# Patient Record
Sex: Male | Born: 1965 | Race: White | Hispanic: No | Marital: Single | Smoking: Current every day smoker
Health system: Southern US, Community
[De-identification: ages and names within clinical notes are randomized; demographics above are authoritative.]

## PROBLEM LIST (undated history)

## (undated) DIAGNOSIS — F1010 Alcohol abuse, uncomplicated: Secondary | ICD-10-CM

## (undated) DIAGNOSIS — F1910 Other psychoactive substance abuse, uncomplicated: Secondary | ICD-10-CM

## (undated) DIAGNOSIS — F1020 Alcohol dependence, uncomplicated: Secondary | ICD-10-CM

## (undated) HISTORY — PX: HERNIA REPAIR: SHX51

---

## 2001-04-17 ENCOUNTER — Emergency Department (HOSPITAL_COMMUNITY): Admission: EM | Admit: 2001-04-17 | Discharge: 2001-04-17 | Payer: Self-pay | Admitting: Internal Medicine

## 2005-08-19 ENCOUNTER — Emergency Department (HOSPITAL_COMMUNITY): Admission: EM | Admit: 2005-08-19 | Discharge: 2005-08-20 | Payer: Self-pay | Admitting: Emergency Medicine

## 2011-11-22 ENCOUNTER — Encounter (HOSPITAL_COMMUNITY): Payer: Self-pay | Admitting: Emergency Medicine

## 2011-11-22 ENCOUNTER — Emergency Department (HOSPITAL_COMMUNITY)
Admission: EM | Admit: 2011-11-22 | Discharge: 2011-11-22 | Payer: Self-pay | Attending: Emergency Medicine | Admitting: Emergency Medicine

## 2011-11-22 DIAGNOSIS — F1010 Alcohol abuse, uncomplicated: Secondary | ICD-10-CM | POA: Insufficient documentation

## 2011-11-22 DIAGNOSIS — R002 Palpitations: Secondary | ICD-10-CM | POA: Insufficient documentation

## 2011-11-22 DIAGNOSIS — F1910 Other psychoactive substance abuse, uncomplicated: Secondary | ICD-10-CM

## 2011-11-22 DIAGNOSIS — T07XXXA Unspecified multiple injuries, initial encounter: Secondary | ICD-10-CM

## 2011-11-22 DIAGNOSIS — Z79899 Other long term (current) drug therapy: Secondary | ICD-10-CM | POA: Insufficient documentation

## 2011-11-22 DIAGNOSIS — IMO0002 Reserved for concepts with insufficient information to code with codable children: Secondary | ICD-10-CM | POA: Insufficient documentation

## 2011-11-22 HISTORY — DX: Alcohol abuse, uncomplicated: F10.10

## 2011-11-22 HISTORY — DX: Alcohol dependence, uncomplicated: F10.20

## 2011-11-22 NOTE — ED Notes (Signed)
Informed patient and/or family of status. [employer] remains at bedside. No voiced complaints presently. NAD. Awaiting disposition

## 2011-11-22 NOTE — ED Provider Notes (Signed)
History     CSN: [id number]  Arrival date & time 11/22/11  1318   First MD Initiated Contact with Patient 11/22/11 1456      Chief Complaint  Patient presents with  . Ingestion   patient with a known history of alcohol abuse. Brought in by police today in custody after being pulled over. States his last drink was approximately 3-4 hours ago and admits to chronic ethanol abuse. He also, states he took approximately 4 of his Valium tablets that he had with him. He states he was apparently trying to "get rid of them." He normally takes them on a daily basis. He denies any symptoms other than having some abrasions to his forehead from being taken into custody. His tetanus is up to date. He denies any headache. No loss of consciousness. Denies any chest pain, shortness of breath. He has no abdominal pain. No nausea, vomiting. He denies any complaints at this time. Somewhat uncooperative with his exam. He appears to be awake and alert. This time. He specifically denies any suicidal or homicidal thoughts. He denies any other coingestants.  (Consider location/radiation/quality/duration/timing/severity/associated sxs/prior treatment) HPI  Past Medical History  Diagnosis Date  . Alcoholism     History reviewed. No pertinent past surgical history.  No family history on file.  History  Substance Use Topics  . Smoking status: Current Everyday Smoker  . Smokeless tobacco: Not on file  . Alcohol Use: Yes      Review of Systems  Constitutional: Negative for fatigue.  Respiratory: Negative for chest tightness and shortness of breath.   Cardiovascular: Positive for palpitations.  Neurological: Negative for dizziness and syncope.  Psychiatric/Behavioral: Negative for confusion.  All other systems reviewed and are negative.    Allergies  Review of patient's allergies indicates no known allergies.  Home Medications   Current Outpatient Rx  Name Route Sig Dispense Refill  . DIAZEPAM 10  MG PO TABS Oral Take 10 mg by mouth every 6 (six) hours as needed. anxiety      BP 142/111  Pulse 96  Temp(Src) 97.6 F (36.4 C) (Oral)  Resp 20  SpO2 96%  Physical Exam  Nursing note and vitals reviewed. Constitutional: He is oriented to person, place, and time. He appears well-developed and well-nourished. No distress.  HENT:  Head: Normocephalic.  Mouth/Throat: Oropharynx is clear and moist.       Scattered abrasions to the right side of the forehead with some dried blood. No active bleeding. No bony deformity. No hematoma.  Eyes: Pupils are equal, round, and reactive to light.       Pupils 3-4 mm, equal, round, reactive to light  Cardiovascular: Normal heart sounds.   Pulmonary/Chest: Breath sounds normal.  Abdominal: Soft.  Musculoskeletal: Normal range of motion.  Neurological: He is alert and oriented to person, place, and time.       Awake alert. Speech is fluent. Cranial nerves normal as tested.  Skin: Skin is warm and dry.  Psychiatric:       Patient's answers questions appropriately. Awake, alert. No sign of any abnormal behavior    ED Course  Procedures (including critical care time)  Labs Reviewed - No data to display No results found.   No diagnosis found.    MDM  Pt is seen and examined;  Initial history and physical completed.  Will follow.      Plan:  Will observe for a brief period.  No indications for labs at this time.  Awake  and alert.  NAD.  Exam normal other than some abrasions.    Will provide wound care and observe for a short period,  Has already been in ED for over 1.5 hours with no behavior change.  Td up to date. No sig. ETOH intoxication appreciated.     4:23 PM  Patient has been continually observed. He remains awake, alert, oriented, with no signs of any lethargy or confusion. He'll be stable for discharge back with the [employer]. They're instructed to bring him back to the ER for any concerns or changing symptoms    Kirstyn Lean A.  Patrica Duel, MD 11/22/11 1624

## 2011-11-22 NOTE — ED Notes (Signed)
[employer] had pt in custody and pt takes 4-6  10mg   Valium pt also has etoh on board

## 2011-11-22 NOTE — ED Notes (Signed)
Presently in custody of [employer]. Pt observed by [employer] swallow handful of pills. Found with more pills on person, valium 10mg  pills. Pt admits to just swallowing 4 pills. States he took them "to get rid of them".

## 2015-07-25 ENCOUNTER — Emergency Department (HOSPITAL_COMMUNITY): Payer: Self-pay

## 2015-07-25 ENCOUNTER — Encounter (HOSPITAL_COMMUNITY): Payer: Self-pay | Admitting: Emergency Medicine

## 2015-07-25 ENCOUNTER — Emergency Department (HOSPITAL_COMMUNITY)
Admission: EM | Admit: 2015-07-25 | Discharge: 2015-07-26 | Disposition: A | Discharge status: Home / Self Care | Payer: Self-pay | Attending: Emergency Medicine | Admitting: Emergency Medicine

## 2015-07-25 DIAGNOSIS — J159 Unspecified bacterial pneumonia: Secondary | ICD-10-CM | POA: Insufficient documentation

## 2015-07-25 DIAGNOSIS — R112 Nausea with vomiting, unspecified: Secondary | ICD-10-CM | POA: Insufficient documentation

## 2015-07-25 DIAGNOSIS — J189 Pneumonia, unspecified organism: Secondary | ICD-10-CM

## 2015-07-25 DIAGNOSIS — R197 Diarrhea, unspecified: Secondary | ICD-10-CM | POA: Insufficient documentation

## 2015-07-25 DIAGNOSIS — Z720 Tobacco use: Secondary | ICD-10-CM | POA: Insufficient documentation

## 2015-07-25 DIAGNOSIS — F1020 Alcohol dependence, uncomplicated: Secondary | ICD-10-CM | POA: Insufficient documentation

## 2015-07-25 LAB — COMPREHENSIVE METABOLIC PANEL
ALT: 12 U/L — ABNORMAL LOW (ref 17–63)
ANION GAP: 6 (ref 5–15)
AST: 21 U/L (ref 15–41)
Albumin: 3.3 g/dL — ABNORMAL LOW (ref 3.5–5.0)
Alkaline Phosphatase: 93 U/L (ref 38–126)
BUN: 15 mg/dL (ref 6–20)
CO2: 29 mmol/L (ref 22–32)
Calcium: 8.6 mg/dL — ABNORMAL LOW (ref 8.9–10.3)
Chloride: 102 mmol/L (ref 101–111)
Creatinine, Ser: 1.01 mg/dL (ref 0.61–1.24)
GFR calc Af Amer: >60 mL/min (ref >60)
GFR calc non Af Amer: >60 mL/min (ref >60)
GLUCOSE: 127 mg/dL — AB (ref 65–99)
Potassium: 3.8 mmol/L (ref 3.5–5.1)
Sodium: 137 mmol/L (ref 135–145)
Total Bilirubin: 0.6 mg/dL (ref 0.3–1.2)
Total Protein: 7 g/dL (ref 6.5–8.1)

## 2015-07-25 LAB — CBC WITH DIFFERENTIAL/PLATELET
Basophils Absolute: 0.1 10*3/uL (ref 0.0–0.1)
Basophils Relative: 1 %
Eosinophils Absolute: 0.4 10*3/uL (ref 0.0–0.7)
Eosinophils Relative: 3 %
HCT: 45.6 % (ref 39.0–52.0)
Hemoglobin: 16.2 g/dL (ref 13.0–17.0)
Lymphocytes Relative: 22 %
Lymphs Abs: 2.9 10*3/uL (ref 0.7–4.0)
MCH: 36.7 pg — ABNORMAL HIGH (ref 26.0–34.0)
MCHC: 35.5 g/dL (ref 30.0–36.0)
MCV: 103.4 fL — ABNORMAL HIGH (ref 78.0–100.0)
Monocytes Absolute: 1.2 10*3/uL — ABNORMAL HIGH (ref 0.1–1.0)
Monocytes Relative: 9 %
NEUTROS PCT: 65 %
Neutro Abs: 8.5 10*3/uL — ABNORMAL HIGH (ref 1.7–7.7)
Platelets: 238 10*3/uL (ref 150–400)
RBC: 4.41 MIL/uL (ref 4.22–5.81)
RDW: 13.3 % (ref 11.5–15.5)
WBC: 13.1 10*3/uL — AB (ref 4.0–10.5)

## 2015-07-25 LAB — RAPID URINE DRUG SCREEN, HOSP PERFORMED
Amphetamines: NOT DETECTED
BENZODIAZEPINES: NOT DETECTED
Barbiturates: NOT DETECTED
Cocaine: NOT DETECTED
OPIATES: NOT DETECTED
TETRAHYDROCANNABINOL: NOT DETECTED

## 2015-07-25 LAB — ETHANOL: Alcohol, Ethyl (B): <5 mg/dL (ref <5)

## 2015-07-25 MED ORDER — NICOTINE 21 MG/24HR TD PT24
21.0000 mg | MEDICATED_PATCH | Freq: Every day | TRANSDERMAL | Status: DC
  Administered 2015-07-26: 21 mg via TRANSDERMAL
  Filled 2015-07-25: qty 1

## 2015-07-25 MED ORDER — IBUPROFEN 200 MG PO TABS: 600.0000 mg | ORAL_TABLET | Freq: Three times a day (TID) | ORAL | Status: DC | PRN

## 2015-07-25 MED ORDER — SODIUM CHLORIDE 0.9 % IV BOLUS (SEPSIS)
1000.0000 mL | Freq: Once | INTRAVENOUS | Status: AC
  Administered 2015-07-25: 1000 mL via INTRAVENOUS

## 2015-07-25 MED ORDER — ONDANSETRON HCL 4 MG PO TABS: 4.0000 mg | ORAL_TABLET | Freq: Three times a day (TID) | ORAL | Status: DC | PRN

## 2015-07-25 MED ORDER — AMOXICILLIN-POT CLAVULANATE 875-125 MG PO TABS
1.0000 | ORAL_TABLET | Freq: Two times a day (BID) | ORAL | Status: DC
  Administered 2015-07-25 – 2015-07-26 (×2): 1 via ORAL
  Filled 2015-07-25 (×2): qty 1

## 2015-07-25 MED ORDER — THIAMINE HCL 100 MG/ML IJ SOLN: 100.0000 mg | Freq: Every day | INTRAMUSCULAR | Status: DC

## 2015-07-25 MED ORDER — ACETAMINOPHEN 325 MG PO TABS
650.0000 mg | ORAL_TABLET | ORAL | Status: DC | PRN
  Administered 2015-07-25: 650 mg via ORAL
  Filled 2015-07-25: qty 2

## 2015-07-25 MED ORDER — LORAZEPAM 1 MG PO TABS: 0.0000 mg | ORAL_TABLET | Freq: Two times a day (BID) | ORAL | Status: DC

## 2015-07-25 MED ORDER — LORAZEPAM 1 MG PO TABS
0.0000 mg | ORAL_TABLET | Freq: Four times a day (QID) | ORAL | Status: DC
  Administered 2015-07-25: 1 mg via ORAL
  Administered 2015-07-26: 2 mg via ORAL
  Filled 2015-07-25: qty 1
  Filled 2015-07-25: qty 2

## 2015-07-25 MED ORDER — VITAMIN B-1 100 MG PO TABS
100.0000 mg | ORAL_TABLET | Freq: Every day | ORAL | Status: DC
  Administered 2015-07-25 – 2015-07-26 (×2): 100 mg via ORAL
  Filled 2015-07-25 (×2): qty 1

## 2015-07-25 NOTE — ED Notes (Signed)
Gave patient a sandwich.

## 2015-07-25 NOTE — ED Notes (Signed)
Pt reports drinking approximately 1/5 liquor per day. Yesterday had 1/5 liquor and a 12 pack of beer. Is starting to have tremors and feeling nauseous. Was treated for alcoholism "a long time ago." Says drinking has been a problem "all his life." No other c/c. Reports losing 40 lbs in 6 months.
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
## 2015-07-25 NOTE — ED Notes (Signed)
Informed the pt that a urine specimen.

## 2015-07-25 NOTE — ED Provider Notes (Signed)
CSN: [id number]     Arrival date & time 07/25/15  1656 History   First MD Initiated Contact with Patient 07/25/15 1902     Chief Complaint  Patient presents with  . Delirium Tremens (DTS)  . Alcohol Problem     (Consider location/radiation/quality/duration/timing/severity/associated sxs/prior Treatment) Patient is a 49 y.o. male presenting with alcohol problem. The history is provided by the patient.  Alcohol Problem This is a chronic problem. Pertinent negatives include no chest pain, no abdominal pain, no headaches and no shortness of breath.  patient presents for treatment of his alcoholism.  He states that he drinks a fifth of liquor and a case of beer a day. He states he last drank yesterday. States he started to have nausea vomiting diarrhea. States he has this when he is withdrawing. Denies other drug use but does smoke. States that he also has had a cough with some yellow sputum production. States he's felt somewhat bad all over.  Past Medical History  Diagnosis Date  . Alcoholism (HCC)   . Alcohol abuse    History reviewed. No pertinent past surgical history. History reviewed. No pertinent family history. Social History  Substance Use Topics  . Smoking status: Current Every Day Smoker -- 1.00 packs/day for 30 years    Types: Cigarettes  . Smokeless tobacco: None  . Alcohol Use: 7.2 oz/week    12 Cans of beer per week     Comment: Avg 12 beers daily x 20 yrs    Review of Systems  Constitutional: Positive for fatigue. Negative for activity change and appetite change.  Eyes: Negative for pain.  Respiratory: Positive for cough. Negative for chest tightness and shortness of breath.   Cardiovascular: Negative for chest pain and leg swelling.  Gastrointestinal: Positive for nausea, vomiting and diarrhea. Negative for abdominal pain.  Genitourinary: Negative for flank pain.  Musculoskeletal: Negative for back pain and neck stiffness.  Skin: Negative for rash.  Neurological:  Negative for weakness, numbness and headaches.  Psychiatric/Behavioral: Positive for dysphoric mood. Negative for suicidal ideas and behavioral problems.      Allergies  Review of patient's allergies indicates no known allergies.  Home Medications   Prior to Admission medications   Not on File   BP 129/76 mmHg  Pulse 80  Temp(Src) 98.1 F (36.7 C) (Oral)  Resp 16  SpO2 94% Physical Exam  Constitutional: He is oriented to person, place, and time. He appears well-developed and well-nourished.  HENT:  Head: Normocephalic and atraumatic.  Neck: Normal range of motion. Neck supple.  Cardiovascular: Normal rate, regular rhythm and normal heart sounds.   No murmur heard. Pulmonary/Chest: He has wheezes.  Mild scattered wheezes, more harsh on the left.  Abdominal: Soft. Bowel sounds are normal. He exhibits no distension and no mass. There is no tenderness. There is no rebound and no guarding.  Musculoskeletal: Normal range of motion. He exhibits no edema.  Neurological: He is alert and oriented to person, place, and time. No cranial nerve deficit.  Skin: Skin is warm and dry.  Psychiatric: He has a normal mood and affect.  Nursing note and vitals reviewed.   ED Course  Procedures (including critical care time) Labs Review Labs Reviewed  COMPREHENSIVE METABOLIC PANEL - Abnormal; Notable for the following:    Glucose, Bld 127 (*)    Calcium 8.6 (*)    Albumin 3.3 (*)    ALT 12 (*)    All other components within normal limits  CBC WITH DIFFERENTIAL/PLATELET -  Abnormal; Notable for the following:    WBC 13.1 (*)    MCV 103.4 (*)    MCH 36.7 (*)    Neutro Abs 8.5 (*)    Monocytes Absolute 1.2 (*)    All other components within normal limits  ETHANOL  URINE RAPID DRUG SCREEN, HOSP PERFORMED    Imaging Review Dg Chest 2 View  07/25/2015   CLINICAL DATA:  Cough x 1 + month - pt states hx asthma - pt states he is here for detox - long time smoker - Pt reports drinking  approximately 1/5 liquor per day. Yesterday had 1/5 liquor and a 12 pack of beer.  EXAM: CHEST  2 VIEW  COMPARISON:  None.  FINDINGS: Lungs are hyperinflated. There is perihilar peribronchial thickening. More focal opacity is identified in the right mid lung zone raising the question of developing infiltrate. No pulmonary edema or pleural effusions.  IMPRESSION: 1. Significant hyperinflation and bronchitic changes. 2. Question of infiltrate in the right mid lung zone. Followup PA and lateral chest X-ray is recommended in 3-4 weeks following trial of antibiotic therapy to ensure resolution and exclude underlying malignancy.   Electronically Signed   By: Norva Pavlov M.D.   On: 07/25/2015 19:25   I have personally reviewed and evaluated these images and lab results as part of my medical decision-making.   EKG Interpretation None      MDM   Final diagnoses:  Alcoholism (HCC)  CAP (community acquired pneumonia)    Patient requesting treatment for his alcoholism. Some depression. Also found to have pneumonia. Oral antibiotics given. States he has had severe withdrawal in the past. Appears medically cleared with the pneumonia to be seen by TTS.    Benjiman Core, MD 07/26/15 [phone]

## 2015-07-26 DIAGNOSIS — F1020 Alcohol dependence, uncomplicated: Secondary | ICD-10-CM | POA: Diagnosis present

## 2015-07-26 MED ORDER — CEFTRIAXONE SODIUM 1 G IJ SOLR
1.0000 g | Freq: Once | INTRAMUSCULAR | Status: AC
  Administered 2015-07-26: 1 g via INTRAMUSCULAR
  Filled 2015-07-26: qty 10

## 2015-07-26 MED ORDER — BENZONATATE 100 MG PO CAPS
200.0000 mg | ORAL_CAPSULE | Freq: Once | ORAL | Status: AC
  Administered 2015-07-26: 200 mg via ORAL
  Filled 2015-07-26: qty 2

## 2015-07-26 MED ORDER — AMOXICILLIN-POT CLAVULANATE 875-125 MG PO TABS
1.0000 | ORAL_TABLET | Freq: Two times a day (BID) | ORAL | Status: DC
  Administered 2015-07-26: 1 via ORAL
  Filled 2015-07-26: qty 1

## 2015-07-26 MED ORDER — AMOXICILLIN-POT CLAVULANATE 875-125 MG PO TABS: 1.0000 | ORAL_TABLET | Freq: Two times a day (BID) | ORAL | Status: DC

## 2015-07-26 MED ORDER — CHLORDIAZEPOXIDE HCL 25 MG PO CAPS: ORAL_CAPSULE | ORAL | Status: DC

## 2015-07-26 NOTE — BH Assessment (Signed)
[facility] Assessment Progress Note  Per Carolanne Grumbling, MD, this pt does not require psychiatric hospitalization at this time.  He is to be discharged from [facility] with substance abuse treatment referrals.  Discharge instructions include referral information for [facility] for outpatient treatment, and for the [facility] for residential treatment.  Pt's nurse, Morrie Sheldon, and the EDP, Dr Criss Alvine, have been notified.  Doylene Canning, MA Triage Specialist [phone]

## 2015-07-26 NOTE — ED Notes (Signed)
TTS UPDATED ON PT'S CURRENT STATUS

## 2015-07-26 NOTE — ED Provider Notes (Signed)
Psych has seen patient and cleared him for discharge from a psych perspective. Outpatient resources for alcoholism. Does not appear to be withdrawing currently though has a significant risk to. Will discharge on librium. Patient encouraged to find outpatient detox. As for his PNA, he is afebrile, not hypoxic and no increased WOB. Able to ambulate without dyspnea or hypoxia. No signs of sepsis. Discharge with antibiotics.  Pricilla Loveless, MD 07/26/15 [phone]
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
## 2015-07-26 NOTE — Discharge Instructions (Addendum)
To help you maintain a sober lifestyle, a substance abuse treatment program may be beneficial to you.  Contact one of the following treatment providers to see about enrolling in their program:  OUTPATIENT PROGRAMS:       [facility]      [address]      [place], [place] [address]      [phone]      New patients are seen at the walk-in clinic every Tuesday from 9:00 am - 12:00 pm.  RESIDENTIAL PROGRAMS:       [facility]      [address]      [place], [place] [address]      [phone]      [email or web address]

## 2015-07-26 NOTE — ED Notes (Signed)
Per [facility] psych provider, pt is discharged from psych stand point, EDP can discharge when pt deemed appropriate.

## 2015-07-26 NOTE — B[facility] Suicide Risk Assessment (Cosign Needed)
Suicide Risk Assessment  Discharge Assessment   [facility] Discharge Suicide Risk Assessment   Demographic Factors:  Male, Caucasian, Low socioeconomic status and Living alone  Total Time spent with patient: 20 minutes  Musculoskeletal: Strength & Muscle Tone: within normal limits Gait & Station: normal Patient leans: N/A  Psychiatric Specialty Exam:     Blood pressure 125/84, pulse 92, temperature 98 F (36.7 C), temperature source Oral, resp. rate 17, height  (1.753 m), weight 74.844 kg (165 lb), SpO2 94 %.Body mass index is 24.36 kg/(m^2).  General Appearance: Casual  Eye Contact:: Good  Speech: Clear and Coherent and Normal Rate  Volume: Normal  Mood: Depressed  Affect: Congruent  Thought Process: Coherent, Goal Directed and Intact  Orientation: Full (Time, Place, and Person)  Thought Content: WDL  Suicidal Thoughts: No  Homicidal Thoughts: No  Memory: Immediate; Good Recent; Good Remote; Good  Judgement: Fair  Insight: Good  Psychomotor Activity: Normal  Concentration: Good  Recall: NA  Fund of Knowledge:Fair  Language: Good  Akathisia: NA  Handed: Right  AIMS (if indicated):    Assets: Desire for Improvement  ADL's: Intact  Cognition: WNL            Has this patient used any form of tobacco in the last 30 days? (Cigarettes, Smokeless Tobacco, Cigars, and/or Pipes) Yes, A prescription for an FDA-approved tobacco cessation medication was offered at discharge and the patient refused  Mental Status Per Nursing Assessment::   On Admission:     Current Mental Status by Physician: NA  Loss Factors: NA  Historical Factors: NA  Risk Reduction Factors:   Religious beliefs about death  Continued Clinical Symptoms:  Alcohol/Substance Abuse/Dependencies  Cognitive Features That Contribute To Risk:  Polarized thinking    Suicide Risk:  Minimal: No identifiable suicidal ideation.  Patients presenting  with no risk factors but with morbid ruminations; may be classified as minimal risk based on the severity of the depressive symptoms  Principal Problem: Alcohol use disorder, severe, dependence [facility]) Discharge Diagnoses:  Patient Active Problem List   Diagnosis Date Noted  . Alcohol use disorder, severe, dependence ([facility]) [F10.20] 07/26/2015    Priority: High  . Alcohol abuse [F10.10]     Follow-up Information    Follow up with [facility]   Specialty:  Emergency Medicine   Why:  If symptoms worsen   Contact information:   [address] [id number] mc [place] [address] [phone]      Follow up with [facility]     In 1 week.   Why:  for follow up of your pneumonia   Contact information:   [address] [place] [address] [phone]      Plan Of Care/Follow-up recommendations:  Activity:  As tolerated Diet:  regular  Is patient on multiple antipsychotic therapies at discharge:  No   Has Patient had three or more failed trials of antipsychotic monotherapy by history:  No  Recommended Plan for Multiple Antipsychotic Therapies: NA    Kanetra Ho C   PMHNP-BC 07/26/2015, 12:10 PM

## 2015-11-20 ENCOUNTER — Emergency Department (HOSPITAL_COMMUNITY)
Admission: EM | Admit: 2015-11-20 | Discharge: 2015-11-21 | Disposition: A | Discharge status: Home / Self Care | Payer: Self-pay | Attending: Emergency Medicine | Admitting: Emergency Medicine

## 2015-11-20 ENCOUNTER — Encounter (HOSPITAL_COMMUNITY): Payer: Self-pay | Admitting: Emergency Medicine

## 2015-11-20 DIAGNOSIS — R4182 Altered mental status, unspecified: Secondary | ICD-10-CM | POA: Insufficient documentation

## 2015-11-20 DIAGNOSIS — F17210 Nicotine dependence, cigarettes, uncomplicated: Secondary | ICD-10-CM | POA: Insufficient documentation

## 2015-11-20 DIAGNOSIS — F10121 Alcohol abuse with intoxication delirium: Secondary | ICD-10-CM | POA: Insufficient documentation

## 2015-11-20 DIAGNOSIS — F10921 Alcohol use, unspecified with intoxication delirium: Secondary | ICD-10-CM

## 2015-11-20 DIAGNOSIS — F919 Conduct disorder, unspecified: Secondary | ICD-10-CM | POA: Insufficient documentation

## 2015-11-20 LAB — CBC WITH DIFFERENTIAL/PLATELET
BASOS ABS: 0 10*3/uL (ref 0.0–0.1)
BASOS PCT: 1 %
EOS ABS: 0.2 10*3/uL (ref 0.0–0.7)
EOS PCT: 2 %
HCT: 44.3 % (ref 39.0–52.0)
Hemoglobin: 14.9 g/dL (ref 13.0–17.0)
Lymphocytes Relative: 26 %
Lymphs Abs: 2.2 10*3/uL (ref 0.7–4.0)
MCH: 34.6 pg — AB (ref 26.0–34.0)
MCHC: 33.6 g/dL (ref 30.0–36.0)
MCV: 102.8 fL — AB (ref 78.0–100.0)
Monocytes Absolute: 0.7 10*3/uL (ref 0.1–1.0)
Monocytes Relative: 8 %
NEUTROS PCT: 63 %
Neutro Abs: 5.3 10*3/uL (ref 1.7–7.7)
PLATELETS: 229 10*3/uL (ref 150–400)
RBC: 4.31 MIL/uL (ref 4.22–5.81)
RDW: 12.8 % (ref 11.5–15.5)
WBC: 8.4 10*3/uL (ref 4.0–10.5)

## 2015-11-20 LAB — COMPREHENSIVE METABOLIC PANEL
ALK PHOS: 59 U/L (ref 38–126)
ALT: 33 U/L (ref 17–63)
ANION GAP: 11 (ref 5–15)
AST: 67 U/L — ABNORMAL HIGH (ref 15–41)
Albumin: 4.1 g/dL (ref 3.5–5.0)
BUN: 15 mg/dL (ref 6–20)
CALCIUM: 8.7 mg/dL — AB (ref 8.9–10.3)
CO2: 26 mmol/L (ref 22–32)
CREATININE: 1.09 mg/dL (ref 0.61–1.24)
Chloride: 109 mmol/L (ref 101–111)
Glucose, Bld: 115 mg/dL — ABNORMAL HIGH (ref 65–99)
Potassium: 3.8 mmol/L (ref 3.5–5.1)
Sodium: 146 mmol/L — ABNORMAL HIGH (ref 135–145)
Total Bilirubin: 0.5 mg/dL (ref 0.3–1.2)
Total Protein: 7.2 g/dL (ref 6.5–8.1)

## 2015-11-20 LAB — URINALYSIS, ROUTINE W REFLEX MICROSCOPIC
BILIRUBIN URINE: NEGATIVE
Glucose, UA: NEGATIVE mg/dL
Hgb urine dipstick: NEGATIVE
Ketones, ur: NEGATIVE mg/dL
Leukocytes, UA: NEGATIVE
NITRITE: NEGATIVE
PROTEIN: NEGATIVE mg/dL
SPECIFIC GRAVITY, URINE: 1.007 (ref 1.005–1.030)
pH: 5 (ref 5.0–8.0)

## 2015-11-20 LAB — RAPID URINE DRUG SCREEN, HOSP PERFORMED
Amphetamines: NOT DETECTED
Barbiturates: NOT DETECTED
Benzodiazepines: NOT DETECTED
Cocaine: NOT DETECTED
Opiates: NOT DETECTED
Tetrahydrocannabinol: NOT DETECTED

## 2015-11-20 LAB — ETHANOL: ALCOHOL ETHYL (B): 265 mg/dL — AB (ref <5)

## 2015-11-20 MED ORDER — HALOPERIDOL LACTATE 5 MG/ML IJ SOLN
5.0000 mg | Freq: Once | INTRAMUSCULAR | Status: AC
  Administered 2015-11-20: 5 mg via INTRAMUSCULAR
  Filled 2015-11-20: qty 1

## 2015-11-20 MED ORDER — LORAZEPAM 2 MG/ML IJ SOLN
INTRAMUSCULAR | Status: AC
  Filled 2015-11-20: qty 1

## 2015-11-20 MED ORDER — STERILE WATER FOR INJECTION IJ SOLN
INTRAMUSCULAR | Status: AC
  Filled 2015-11-20: qty 10

## 2015-11-20 MED ORDER — LORAZEPAM 2 MG/ML IJ SOLN
2.0000 mg | Freq: Once | INTRAMUSCULAR | Status: AC
  Administered 2015-11-20: 2 mg via INTRAMUSCULAR

## 2015-11-20 MED ORDER — ZIPRASIDONE MESYLATE 20 MG IM SOLR
INTRAMUSCULAR | Status: AC
  Filled 2015-11-20: qty 20

## 2015-11-20 MED ORDER — ZIPRASIDONE MESYLATE 20 MG IM SOLR
20.0000 mg | Freq: Once | INTRAMUSCULAR | Status: AC
  Administered 2015-11-20: 20 mg via INTRAMUSCULAR

## 2015-11-20 NOTE — ED Notes (Signed)
Pt taken out of restraints.  Pt calm and understands that when he can walk and is a little more sober he may leave.  Pt states he understands, food is offered to pt and clean clothes given to pt.  Pt alert but unable to walk several steps by himself without stumbling.  Pt moved to hallway to sleep.

## 2015-11-20 NOTE — ED Notes (Signed)
Pt sleeping. 

## 2015-11-20 NOTE — ED Notes (Signed)
Unsuccessful in obtaining a full set of vital signs.  Pt combative.  Dr. Effie Shy called to room.

## 2015-11-20 NOTE — ED Notes (Signed)
Pt remains sleeping with safety sitter at bedside

## 2015-11-20 NOTE — ED Notes (Signed)
Pt started trying to get out of bed and when told he couldn't he started to curse and get aggressive.  Security at bedside.  Pt placed in 2 point restraints and lying in bed.  Safety sitter remains at bedside.

## 2015-11-20 NOTE — Progress Notes (Addendum)
  CM confirms pt is uninsured [employer] resident with no pcp.  CM provided written information for uninsured accepting pcps,  www.needymeds.org, www.goodrx.com, discounted pharmacies and other [employer] such as [employer] , [employer], affordable care act, financial assistance, uninsured dental services, [place] med assist, DSS and  health department  Reviewed resources for [employer] uninsured accepting pcps like [facility], family medicine at [employer], [facility], [facility], local urgent care centers, [facility], [facility] family practice, general medical clinics, family services of the [place], [facility] urgent care plus others, medication resources, [facility] out patient pharmacies and housing  Placed resources on pt bedside table Pt noted to be lying on his back on stretcher snoring [employer] and ED security present   Provided P4CC contact information   Entered in d/c instructions  Please use the resources provided to you in emergency room by case manager to assist with doctor for follow up These [employer] uninsured resources provide possible primary care providers, resources for discounted medications, housing, dental resources, affordable care act information, plus other resources for [employer]

## 2015-11-20 NOTE — ED Notes (Signed)
Bed: WA02 Expected date:  Expected time:  Means of arrival:  Comments: 

## 2015-11-20 NOTE — ED Notes (Signed)
Pt sleeping in hallway.  Safety sitter at bedside.

## 2015-11-20 NOTE — ED Provider Notes (Signed)
CSN: [id number]     Arrival date & time 11/20/15  1354 History   First MD Initiated Contact with Patient 11/20/15 1411     Chief Complaint  Patient presents with  . Alcohol Intoxication     (Consider location/radiation/quality/duration/timing/severity/associated sxs/prior Treatment) HPI   Nicholas Everett is a 50 y.o. male who presents for evaluation of altered mental status. He was sitting at a table, at [facility], when he fell out of the chair on the floor. EMS arrived and found him to be intoxicated and altered. During transport, he was combative. There is no suspected injury from the fall or the period of combativeness. He is unable to give history.  Level 5 caveat- altered mental status   Past Medical History  Diagnosis Date  . Alcoholism ([facility])   . Alcohol abuse    No past surgical history on file. No family history on file. Social History  Substance Use Topics  . Smoking status: Current Every Day Smoker -- 1.00 packs/day for 30 years    Types: Cigarettes  . Smokeless tobacco: None  . Alcohol Use: 7.2 oz/week    12 Cans of beer per week     Comment: Avg 12 beers daily x 20 yrs    Review of Systems  Unable to perform ROS: Mental status change      Allergies  Review of patient's allergies indicates no known allergies.  Home Medications   Prior to Admission medications   Medication Sig Start Date End Date Taking? Authorizing Provider  amoxicillin-clavulanate (AUGMENTIN) 875-125 MG tablet Take 1 tablet by mouth 2 (two) times daily. One po bid x 10 days 07/26/15   Pricilla Loveless, MD  chlordiazePOXIDE (LIBRIUM) 25 MG capsule  PO TID x 1D, then 25-50mg  PO BID X 1D, then 25-50mg  PO QD X 1D 07/26/15   Pricilla Loveless, MD   There were no vitals taken for this visit. Physical Exam  Constitutional: He appears well-developed and well-nourished. He appears distressed (He is holding onto the side of the stretcher shaking it and intermittently groaning and crying out in on  intelligible speech.).  HENT:  Head: Normocephalic and atraumatic.  Right Ear: External ear normal.  Left Ear: External ear normal.  Eyes: Conjunctivae and EOM are normal. Pupils are equal, round, and reactive to light.  Neck: Normal range of motion and phonation normal. Neck supple.  Cardiovascular: Normal rate.   Pulmonary/Chest: Effort normal. He exhibits no bony tenderness.  Musculoskeletal: Normal range of motion.  Neurological: No cranial nerve deficit or sensory deficit. He exhibits normal muscle tone. Coordination normal.  Intermittent periods of restlessness, and aggressive behavior  Skin: Skin is warm, dry and intact.  Psychiatric:  Aggressive, angry  Nursing note and vitals reviewed.   ED Course  Procedures (including critical care time)   14:15- patient chemically and physically restrained, to prevent injury to yourself or staff.   Medications  ziprasidone (GEODON) 20 MG injection (not administered)  sterile water (preservative free) injection (not administered)  LORazepam (ATIVAN) 2 MG/ML injection (not administered)  ziprasidone (GEODON) injection 20 mg (20 mg Intramuscular Given 11/20/15 1415)  LORazepam (ATIVAN) injection 2 mg (2 mg Intramuscular Given 11/20/15 1415)    No data found.   5:00 PM Reevaluation with update and discussion. After initial assessment and treatment, an updated evaluation reveals he is sleeping comfortably. His labs have not been obtained nursing staff state that "he just got his medication.". [name]   CRITICAL CARE Performed by: Flint Melter Total  critical care time: 35 minutes Critical care time was exclusive of separately billable procedures and treating other patients. Critical care was necessary to treat or prevent imminent or life-threatening deterioration. Critical care was time spent personally by me on the following activities: development of treatment plan with patient and/or surrogate as well as nursing,  discussions with consultants, evaluation of patient's response to treatment, examination of patient, obtaining history from patient or surrogate, ordering and performing treatments and interventions, ordering and review of laboratory studies, ordering and review of radiographic studies, pulse oximetry and re-evaluation of patient's condition.   Labs Review Labs Reviewed  COMPREHENSIVE METABOLIC PANEL  ETHANOL  CBC WITH DIFFERENTIAL/PLATELET  URINE RAPID DRUG SCREEN, HOSP PERFORMED  URINALYSIS, ROUTINE W REFLEX MICROSCOPIC (NOT AT [facility])    Imaging Review No results found. I have personally reviewed and evaluated these images and lab results as part of my medical decision-making.   EKG Interpretation None      MDM   Final diagnoses:  Alcohol intoxication, with delirium (HCC)    Altered mental status, likely alcohol intoxication, requesting sedation and restraint. Medical screening evaluation ordered.  Nursing Notes Reviewed/ Care Coordinated, and agree without changes. Applicable Imaging Reviewed.  Interpretation of Laboratory Data incorporated into ED treatment  Plan:- As per oncoming provider team.    Mancel Bale, MD 11/20/15 [phone]

## 2015-11-20 NOTE — ED Notes (Signed)
Bed: WHALA Expected date:  Expected time:  Means of arrival:  Comments: 

## 2015-11-20 NOTE — ED Notes (Signed)
Unable to collect labs at this time because of patient behavior. 

## 2015-11-20 NOTE — ED Notes (Signed)
Pt sleeping in bed with safety sitter at bedside.  4 point restraints removed.  VS stable

## 2015-11-20 NOTE — ED Notes (Signed)
Per EMS: Pt from [facility].  Pt fell asleep with his hands under his chin and then fell into the floor. Pt has aroma of etoh and was combative with EMS.

## 2015-11-20 NOTE — ED Notes (Signed)
Pt starting to arouse.  Tries to sit up but lays back down when told to.  Safety sitter remains at bedside

## 2015-11-21 NOTE — Discharge Instructions (Signed)
Alcohol Intoxication  Alcohol intoxication occurs when the amount of alcohol that a person has consumed impairs his or her ability to mentally and physically function. Alcohol directly impairs the normal chemical activity of the brain. Drinking large amounts of alcohol can lead to changes in mental function and behavior, and it can cause many physical effects that can be harmful.   Alcohol intoxication can range in severity from mild to very severe. Various factors can affect the level of intoxication that occurs, such as the person's age, gender, weight, frequency of alcohol consumption, and the presence of other medical conditions (such as diabetes, seizures, or heart conditions). Dangerous levels of alcohol intoxication may occur when people drink large amounts of alcohol in a short period (binge drinking). Alcohol can also be especially dangerous when combined with certain prescription medicines or "recreational" drugs.  SIGNS AND SYMPTOMS  Some common signs and symptoms of mild alcohol intoxication include:  · Loss of coordination.  · Changes in mood and behavior.  · Impaired judgment.  · Slurred speech.  As alcohol intoxication progresses to more severe levels, other signs and symptoms will appear. These may include:  · Vomiting.  · Confusion and impaired memory.  · Slowed breathing.  · Seizures.  · Loss of consciousness.  DIAGNOSIS   Your health care provider will take a medical history and perform a physical exam. You will be asked about the amount and type of alcohol you have consumed. Blood tests will be done to measure the concentration of alcohol in your blood. In many places, your blood alcohol level must be lower than 80 mg/dL (0.08%) to legally drive. However, many dangerous effects of alcohol can occur at much lower levels.   TREATMENT   People with alcohol intoxication often do not require treatment. Most of the effects of alcohol intoxication are temporary, and they go away as the alcohol naturally  leaves the body. Your health care provider will monitor your condition until you are stable enough to go home. Fluids are sometimes given through an IV access tube to help prevent dehydration.   HOME CARE INSTRUCTIONS  · Do not drive after drinking alcohol.  · Stay hydrated. Drink enough water and fluids to keep your urine clear or pale yellow. Avoid caffeine.    · Only take over-the-counter or prescription medicines as directed by your health care provider.    SEEK MEDICAL CARE IF:   · You have persistent vomiting.    · You do not feel better after a few days.  · You have frequent alcohol intoxication. Your health care provider can help determine if you should see a substance use treatment counselor.  SEEK IMMEDIATE MEDICAL CARE IF:   · You become shaky or tremble when you try to stop drinking.    · You shake uncontrollably (seizure).    · You throw up (vomit) blood. This may be bright red or may look like black coffee grounds.    · You have blood in your stool. This may be bright red or may appear as a black, tarry, bad smelling stool.    · You become lightheaded or faint.    MAKE SURE YOU:   · Understand these instructions.  · Will watch your condition.  · Will get help right away if you are not doing well or get worse.     This information is not intended to replace advice given to you by your health care provider. Make sure you discuss any questions you have with your health care provider.       Document Released: 07/17/2005 Document Revised: 06/09/2013 Document Reviewed: 03/12/2013  Elsevier Interactive Patient Education ©2016 [employer]

## 2015-11-21 NOTE — ED Notes (Signed)
Ambulated pt with provider watching.  Pt getting ready to be d/c

## 2016-07-28 ENCOUNTER — Emergency Department (HOSPITAL_COMMUNITY): Payer: Self-pay

## 2016-07-28 ENCOUNTER — Encounter (HOSPITAL_COMMUNITY): Payer: Self-pay | Admitting: Radiology

## 2016-07-28 ENCOUNTER — Emergency Department (HOSPITAL_COMMUNITY)
Admission: EM | Admit: 2016-07-28 | Discharge: 2016-07-28 | Disposition: A | Discharge status: Home / Self Care | Payer: Self-pay | Attending: Emergency Medicine | Admitting: Emergency Medicine

## 2016-07-28 DIAGNOSIS — M25461 Effusion, right knee: Secondary | ICD-10-CM | POA: Insufficient documentation

## 2016-07-28 DIAGNOSIS — M25561 Pain in right knee: Secondary | ICD-10-CM

## 2016-07-28 DIAGNOSIS — F17210 Nicotine dependence, cigarettes, uncomplicated: Secondary | ICD-10-CM | POA: Insufficient documentation

## 2016-07-28 MED ORDER — OXYCODONE-ACETAMINOPHEN 5-325 MG PO TABS: 1.0000 | ORAL_TABLET | Freq: Four times a day (QID) | ORAL | 0 refills | Status: DC | PRN

## 2016-07-28 MED ORDER — OXYCODONE-ACETAMINOPHEN 5-325 MG PO TABS
1.0000 | ORAL_TABLET | Freq: Once | ORAL | Status: AC
  Administered 2016-07-28: 1 via ORAL
  Filled 2016-07-28: qty 1

## 2016-07-28 NOTE — ED Notes (Signed)
PTAR assessed pt and arrived at the conclusion that transport was not a 'medical necessity'

## 2016-07-28 NOTE — ED Triage Notes (Signed)
Per EMS-Pt called EMS for c/o r/knee pain and swelling x 3 hours. Pt stated that he jumped up and when he landed his knee buckled. Pt applied heat. No pain relief. Pt is ambulatory with assist.

## 2016-07-28 NOTE — ED Notes (Signed)
Pt returned requesting PTAR services.

## 2016-07-28 NOTE — ED Provider Notes (Signed)
[facility]-EMERGENCY DEPT Provider Note   CSN: [id number] Arrival date & time: 07/28/16  1507  By signing my name below, I, [name], attest that this documentation has been prepared under the direction and in the presence of [employer], PA-C.  Electronically Signed: Octavia Heir, ED Scribe. 07/28/16. 4:23 PM.    History   Chief Complaint Chief Complaint  Patient presents with  . Knee Injury    right    The history is provided by the patient. No language interpreter was used.   HPI Comments: Nicholas Everett is a 50 y.o. male brought in by ambulance, who presents to the Emergency Department complaining of a sudden onset, gradual worsening, moderate right knee pain x 3 hours. Pt has associated swelling to the area. Pt states he jumped out of a dumpster, landed on concrete, and he right knee buckled beneath him. He has applied ice and heat to alleviate swelling without relief. Pt has not had any pain medication. Pt is able to ambulate with assistance. He is unable to apply pressure or bend his knee secondary to increased pain. Associated symptoms include swelling.  No numbness or weakness.  There are no other complaints noted.   Past Medical History:  Diagnosis Date  . Alcohol abuse   . Alcoholism [facility])     Patient Active Problem List   Diagnosis Date Noted  . Alcohol use disorder, severe, dependence (HCC) 07/26/2015  . Alcohol abuse     No past surgical history on file.     Home Medications    Prior to Admission medications   Medication Sig Start Date End Date Taking? Authorizing Provider  amoxicillin-clavulanate (AUGMENTIN) 875-125 MG tablet Take 1 tablet by mouth 2 (two) times daily. One po bid x 10 days 07/26/15   Pricilla Loveless, MD  chlordiazePOXIDE (LIBRIUM) 25 MG capsule 50mg  PO TID x 1D, then 25-50mg  PO BID X 1D, then 25-50mg  PO QD X 1D 07/26/15   Pricilla Loveless, MD  oxyCODONE-acetaminophen (PERCOCET/ROXICET) 5-325 MG tablet Take 1-2 tablets by mouth every 6 (six)  hours as needed for severe pain. 07/28/16   Cheri Fowler, PA-C    Family History No family history on file.  Social History Social History  Substance Use Topics  . Smoking status: Current Every Day Smoker    Packs/day: 1.00    Years: 30.00    Types: Cigarettes  . Smokeless tobacco: Not on file  . Alcohol use 7.2 oz/week    12 Cans of beer per week     Comment: Avg 12 beers daily x 20 yrs     Allergies   Tylenol [acetaminophen]   Review of Systems Review of Systems  Musculoskeletal: Positive for arthralgias and joint swelling.  All other systems reviewed and are negative.    Physical Exam Updated Vital Signs BP 126/76 (BP Location: Right Arm)   Pulse 73   Temp 98.3 F (36.8 C) (Oral)   Resp 16   Ht 5\' 9"  (1.753 m)   Wt 68 kg   SpO2 100%   BMI 22.15 kg/m   Physical Exam  Constitutional: He is oriented to person, place, and time. He appears well-developed and well-nourished.  HENT:  Head: Normocephalic and atraumatic.  Right Ear: External ear normal.  Left Ear: External ear normal.  Eyes: Conjunctivae are normal. No scleral icterus.  Neck: No tracheal deviation present.  Cardiovascular:  Pulses:      Dorsalis pedis pulses are 2+ on the right side, and 2+ on the left  side.  Pulmonary/Chest: Effort normal. No respiratory distress.  Abdominal: He exhibits no distension.  Musculoskeletal: Normal range of motion. He exhibits tenderness.  Exquisite tenderness along medial and lateral joint lines.  No patellar or quadriceps tenderness. Moderate swelling.  No erythema, warmth, bruising. Decreased ROM in flexion due to pain.  Normal straight leg raise.  Neurological: He is alert and oriented to person, place, and time.  PMS intact.   Skin: Skin is warm and dry.  Psychiatric: He has a normal mood and affect. His behavior is normal.     ED Treatments / Results  DIAGNOSTIC STUDIES: Oxygen Saturation is 100% on RA, normal by my interpretation.  COORDINATION OF CARE:   4:21 PM Discussed treatment plan with pt at bedside and pt agreed to plan.  Labs (all labs ordered are listed, but only abnormal results are displayed) Labs Reviewed - No data to display  EKG  EKG Interpretation None       Radiology Ct Knee Right Wo Contrast  Result Date: 07/28/2016 CLINICAL DATA:  Larey SeatFell today and injured knee. EXAM: CT OF THE right KNEE WITHOUT CONTRAST TECHNIQUE: Multidetector CT imaging of the right knee was performed according to the standard protocol. Multiplanar CT image reconstructions were also generated. COMPARISON:  Radiographs 07/28/2016 FINDINGS: The joint spaces are maintained. No acute fracture is identified. There is a moderate-sized joint effusion which could suggest internal derangement. That would be difficult to assess on CT. MRI may be helpful for further evaluation. IMPRESSION: No acute fracture. Moderate-sized joint effusion may suggest internal derangement. Electronically Signed   By: Rudie MeyerP.  Gallerani M.D.   On: 07/28/2016 18:19   Dg Knee Complete 4 Views Right  Result Date: 07/28/2016 CLINICAL DATA:  Pain and swelling of the right knee for 3 hours. EXAM: RIGHT KNEE - COMPLETE 4+ VIEW COMPARISON:  None. FINDINGS: No evidence of fracture, or dislocation. There is a moderate in size high-density suprapatellar joint effusion. No evidence of arthropathy or other focal bone abnormality. Soft tissues are unremarkable. IMPRESSION: High density probably hemorrhagic suprapatellar joint effusion, suggesting a radio occult fracture or internal derangement of the right knee. No displaced fracture is seen. Electronically Signed   By: Ted Mcalpineobrinka  Dimitrova M.D.   On: 07/28/2016 16:13    Procedures Procedures (including critical care time)  Medications Ordered in ED Medications  oxyCODONE-acetaminophen (PERCOCET/ROXICET) 5-325 MG per tablet 1 tablet (1 tablet Oral Given 07/28/16 1629)  oxyCODONE-acetaminophen (PERCOCET/ROXICET) 5-325 MG per tablet 1 tablet (1 tablet  Oral Given 07/28/16 1840)     Initial Impression / Assessment and Plan / ED Course  I have reviewed the triage vital signs and the nursing notes.  Pertinent labs & imaging results that were available during my care of the patient were reviewed by me and considered in my medical decision making (see chart for details).  Clinical Course    Patient presents with traumatic right knee pain with swelling.  PMS intact.  Plain films negative for fracture, although concern for occult fx.  CT obtained, this was negative for fx.  Moderate joint effusion, concern for internal derangment. Recommend outpatient MRI.  Patient placed in knee immobilizer and crutches.  Home with Percocet.  Follow up orthopedics.  Return precautions discussed.  Stable for discharge.  I personally performed the services described in this documentation, which was scribed in my presence. The recorded information has been reviewed and is accurate.  Final Clinical Impressions(s) / ED Diagnoses   Final diagnoses:  Acute pain of right knee  Effusion of right knee    New Prescriptions New Prescriptions   OXYCODONE-ACETAMINOPHEN (PERCOCET/ROXICET) 5-325 MG TABLET    Take 1-2 tablets by mouth every 6 (six) hours as needed for severe pain.     Cheri Fowler, PA-C 07/28/16 1902    Benjiman Core, MD 07/28/16 [phone]

## 2016-10-01 ENCOUNTER — Emergency Department (HOSPITAL_BASED_OUTPATIENT_CLINIC_OR_DEPARTMENT_OTHER): Payer: Self-pay

## 2016-10-01 ENCOUNTER — Emergency Department (HOSPITAL_BASED_OUTPATIENT_CLINIC_OR_DEPARTMENT_OTHER)
Admission: EM | Admit: 2016-10-01 | Discharge: 2016-10-02 | Disposition: A | Discharge status: Home / Self Care | Payer: Self-pay | Attending: Emergency Medicine | Admitting: Emergency Medicine

## 2016-10-01 ENCOUNTER — Encounter (HOSPITAL_BASED_OUTPATIENT_CLINIC_OR_DEPARTMENT_OTHER): Payer: Self-pay | Admitting: Emergency Medicine

## 2016-10-01 DIAGNOSIS — Y939 Activity, unspecified: Secondary | ICD-10-CM | POA: Insufficient documentation

## 2016-10-01 DIAGNOSIS — R739 Hyperglycemia, unspecified: Secondary | ICD-10-CM | POA: Insufficient documentation

## 2016-10-01 DIAGNOSIS — J181 Lobar pneumonia, unspecified organism: Secondary | ICD-10-CM | POA: Insufficient documentation

## 2016-10-01 DIAGNOSIS — E876 Hypokalemia: Secondary | ICD-10-CM | POA: Insufficient documentation

## 2016-10-01 DIAGNOSIS — Y999 Unspecified external cause status: Secondary | ICD-10-CM | POA: Insufficient documentation

## 2016-10-01 DIAGNOSIS — Z79899 Other long term (current) drug therapy: Secondary | ICD-10-CM | POA: Insufficient documentation

## 2016-10-01 DIAGNOSIS — S0081XA Abrasion of other part of head, initial encounter: Secondary | ICD-10-CM | POA: Insufficient documentation

## 2016-10-01 DIAGNOSIS — J189 Pneumonia, unspecified organism: Secondary | ICD-10-CM

## 2016-10-01 DIAGNOSIS — Y92410 Unspecified street and highway as the place of occurrence of the external cause: Secondary | ICD-10-CM | POA: Insufficient documentation

## 2016-10-01 DIAGNOSIS — F17210 Nicotine dependence, cigarettes, uncomplicated: Secondary | ICD-10-CM | POA: Insufficient documentation

## 2016-10-01 HISTORY — DX: Other psychoactive substance abuse, uncomplicated: F19.10

## 2016-10-01 LAB — CBC WITH DIFFERENTIAL/PLATELET
Basophils Absolute: 0 10*3/uL (ref 0.0–0.1)
Basophils Relative: 0 %
EOS PCT: 0 %
Eosinophils Absolute: 0 10*3/uL (ref 0.0–0.7)
HEMATOCRIT: 38.6 % — AB (ref 39.0–52.0)
Hemoglobin: 14 g/dL (ref 13.0–17.0)
LYMPHS PCT: 7 %
Lymphs Abs: 1.2 10*3/uL (ref 0.7–4.0)
MCH: 36.5 pg — ABNORMAL HIGH (ref 26.0–34.0)
MCHC: 36.3 g/dL — AB (ref 30.0–36.0)
MCV: 100.5 fL — AB (ref 78.0–100.0)
MONO ABS: 1.7 10*3/uL — AB (ref 0.1–1.0)
MONOS PCT: 10 %
NEUTROS ABS: 14.1 10*3/uL — AB (ref 1.7–7.7)
Neutrophils Relative %: 83 %
Platelets: 250 10*3/uL (ref 150–400)
RBC: 3.84 MIL/uL — ABNORMAL LOW (ref 4.22–5.81)
RDW: 12 % (ref 11.5–15.5)
WBC: 17 10*3/uL — ABNORMAL HIGH (ref 4.0–10.5)

## 2016-10-01 LAB — URINALYSIS, ROUTINE W REFLEX MICROSCOPIC
Glucose, UA: NEGATIVE mg/dL
Hgb urine dipstick: NEGATIVE
Ketones, ur: NEGATIVE mg/dL
LEUKOCYTES UA: NEGATIVE
NITRITE: NEGATIVE
PROTEIN: NEGATIVE mg/dL
Specific Gravity, Urine: 1.025 (ref 1.005–1.030)
pH: 5.5 (ref 5.0–8.0)

## 2016-10-01 LAB — RAPID URINE DRUG SCREEN, HOSP PERFORMED
Amphetamines: NOT DETECTED
BARBITURATES: NOT DETECTED
Benzodiazepines: NOT DETECTED
Cocaine: NOT DETECTED
Opiates: NOT DETECTED
Tetrahydrocannabinol: POSITIVE — AB

## 2016-10-01 LAB — ACETAMINOPHEN LEVEL

## 2016-10-01 LAB — COMPREHENSIVE METABOLIC PANEL
ALBUMIN: 3 g/dL — AB (ref 3.5–5.0)
ALK PHOS: 65 U/L (ref 38–126)
ALT: 19 U/L (ref 17–63)
ANION GAP: 9 (ref 5–15)
AST: 28 U/L (ref 15–41)
BUN: 9 mg/dL (ref 6–20)
CALCIUM: 8.3 mg/dL — AB (ref 8.9–10.3)
CO2: 25 mmol/L (ref 22–32)
Chloride: 99 mmol/L — ABNORMAL LOW (ref 101–111)
Creatinine, Ser: 0.95 mg/dL (ref 0.61–1.24)
GFR calc Af Amer: >60 mL/min (ref >60)
GFR calc non Af Amer: >60 mL/min (ref >60)
GLUCOSE: 184 mg/dL — AB (ref 65–99)
POTASSIUM: 3.1 mmol/L — AB (ref 3.5–5.1)
SODIUM: 133 mmol/L — AB (ref 135–145)
Total Bilirubin: 0.2 mg/dL — ABNORMAL LOW (ref 0.3–1.2)
Total Protein: 6.7 g/dL (ref 6.5–8.1)

## 2016-10-01 MED ORDER — AZITHROMYCIN 250 MG PO TABS
500.0000 mg | ORAL_TABLET | Freq: Once | ORAL | Status: AC
  Administered 2016-10-01: 500 mg via ORAL
  Filled 2016-10-01: qty 2

## 2016-10-01 MED ORDER — ACETAMINOPHEN 325 MG PO TABS
650.0000 mg | ORAL_TABLET | Freq: Once | ORAL | Status: AC
  Administered 2016-10-01: 650 mg via ORAL
  Filled 2016-10-01: qty 2

## 2016-10-01 MED ORDER — AZITHROMYCIN 250 MG PO TABS
1000.0000 mg | ORAL_TABLET | Freq: Once | ORAL | Status: AC
  Administered 2016-10-01: 1000 mg via ORAL
  Filled 2016-10-01: qty 4
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
## 2016-10-01 NOTE — ED Notes (Signed)
Alerted EDP that all results were back and requested for pt to be updated.
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
## 2016-10-02 MED ORDER — POTASSIUM CHLORIDE CRYS ER 20 MEQ PO TBCR
40.0000 meq | EXTENDED_RELEASE_TABLET | Freq: Once | ORAL | Status: AC
  Administered 2016-10-02: 40 meq via ORAL
  Filled 2016-10-02: qty 2

## 2016-10-02 NOTE — Discharge Instructions (Signed)
Take the azithromycin 1 capsule each day for the next 4 days starting tomorrow. Call the [facility] community wellness [facility] or the number on your discharge instructions tomorrow to arrange to be seen by primary care physician within the next 3 or 4 weeks. You need to have a repeat chest x-ray to make sure that you don't have lung cancer. Ask your new doctor to help you to stop smoking

## 2016-10-07 ENCOUNTER — Encounter (HOSPITAL_COMMUNITY): Payer: Self-pay | Admitting: Emergency Medicine

## 2016-10-07 ENCOUNTER — Emergency Department (HOSPITAL_COMMUNITY)
Admission: EM | Admit: 2016-10-07 | Discharge: 2016-10-07 | Disposition: A | Discharge status: Home / Self Care | Payer: Self-pay | Attending: Emergency Medicine | Admitting: Emergency Medicine

## 2016-10-07 DIAGNOSIS — F17210 Nicotine dependence, cigarettes, uncomplicated: Secondary | ICD-10-CM | POA: Insufficient documentation

## 2016-10-07 DIAGNOSIS — Z79899 Other long term (current) drug therapy: Secondary | ICD-10-CM | POA: Insufficient documentation

## 2016-10-07 DIAGNOSIS — Z09 Encounter for follow-up examination after completed treatment for conditions other than malignant neoplasm: Secondary | ICD-10-CM | POA: Insufficient documentation
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
## 2016-10-07 NOTE — Discharge Instructions (Signed)
Your symptoms seem to have resolved. You are free to return to [facility] and normal activity. Refrain from smoking. You will still need to follow-up with a repeat chest x-ray around January 9 to check to make sure there is not a mass in the lung.

## 2016-10-07 NOTE — ED Provider Notes (Signed)
[facility]-EMERGENCY DEPT Provider Note   CSN: [id number] Arrival date & time: 10/07/16  1101  By signing my name below, I, Placido SouLogan Joldersma, attest that this documentation has been prepared under the direction and in the presence of Hogan Hoobler C. Torah Pinnock, PA-C. Electronically Signed: Placido SouLogan Joldersma, ED Scribe. 10/07/16. 12:05 PM.   History   Chief Complaint Chief Complaint  Patient presents with  . needs chest xray to confirm PNA is cleared    HPI HPI Comments: Nicholas Everett is a 50 y.o. male with a h/o alcoholism and polysubstance abuse who presents to the Emergency Department requesting medical evaluation. Pt was seen on 10/01/2016 with a cough and central CP when coughing and had a CXR performed showing CAP. [facility] and was instructed to come to the ED for medical clearance so he can continue treatment there. He states he has been compliant with his abx and denies any current symptoms. When asked to clarify what he needs from us today, patient states that he just needs a note stating he was seen in the ED and can go back to his treatment program.       The history is provided by the patient and medical records. No language interpreter was used.    Past Medical History:  Diagnosis Date  . Alcohol abuse   . Alcoholism (HCC)   . Polysubstance abuse     Patient Active Problem List   Diagnosis Date Noted  . Alcohol use disorder, severe, dependence (HCC) 07/26/2015  . Alcohol abuse     Past Surgical History:  Procedure Laterality Date  . HERNIA REPAIR         Home Medications    Prior to Admission medications   Medication Sig Start Date End Date Taking? Authorizing Provider  amoxicillin-clavulanate (AUGMENTIN) 875-125 MG tablet Take 1 tablet by mouth 2 (two) times daily. One po bid x 10 days 07/26/15   Pricilla LovelessScott Goldston, MD  chlordiazePOXIDE (LIBRIUM) 25 MG capsule 50mg  PO TID x 1D, then 25-50mg  PO BID X 1D, then 25-50mg  PO QD X 1D 07/26/15   Pricilla LovelessScott Goldston, MD    oxyCODONE-acetaminophen (PERCOCET/ROXICET) 5-325 MG tablet Take 1-2 tablets by mouth every 6 (six) hours as needed for severe pain. 07/28/16   Cheri FowlerKayla Rose, PA-C    Family History No family history on file.  Social History Social History  Substance Use Topics  . Smoking status: Current Every Day Smoker    Packs/day: 1.00    Years: 30.00    Types: Cigarettes  . Smokeless tobacco: Never Used  . Alcohol use 7.2 oz/week    12 Cans of beer per week     Comment: Avg 12 beers daily x 20 yrs, 1/5 of liquor     Allergies   Tylenol [acetaminophen]   Review of Systems Review of Systems  Constitutional: Negative for chills, fatigue and fever.  Respiratory: Negative for cough, shortness of breath and wheezing.   All other systems reviewed and are negative.  Physical Exam Updated Vital Signs BP 112/91 (BP Location: Right Arm)   Pulse 81   Temp 98.2 F (36.8 C) (Oral)   Resp 17   SpO2 96%   Physical Exam  Constitutional: He appears well-developed and well-nourished. No distress.  HENT:  Head: Normocephalic and atraumatic.  Eyes: Conjunctivae are normal.  Neck: Neck supple.  Cardiovascular: Normal rate, regular rhythm, normal heart sounds and intact distal pulses.   Pulmonary/Chest: Effort normal and breath sounds normal. No respiratory distress.  Mild rhonchi noted in the lower right lobe.  Abdominal: Soft. There is no tenderness. There is no guarding.  Musculoskeletal: He exhibits no edema.  Lymphadenopathy:    He has no cervical adenopathy.  Neurological: He is alert.  Skin: Skin is warm and dry. He is not diaphoretic.  Psychiatric: He has a normal mood and affect. His behavior is normal.  Nursing note and vitals reviewed.  ED Treatments / Results  Labs (all labs ordered are listed, but only abnormal results are displayed) Labs Reviewed - No data to display  EKG  EKG Interpretation None       Radiology No results found.  Procedures Procedures  DIAGNOSTIC  STUDIES: Oxygen Saturation is 96% on RA, normal by my interpretation.    COORDINATION OF CARE: 12:04 PM Discussed next steps with pt. Pt verbalized understanding and is agreeable with the plan.    Medications Ordered in ED Medications - No data to display   Initial Impression / Assessment and Plan / ED Course  I have reviewed the triage vital signs and the nursing notes.  Pertinent labs & imaging results that were available during my care of the patient were reviewed by me and considered in my medical decision making (see chart for details).  Clinical Course     Patient presents for medical clearance to return to [facility]. Patient is symptom-free, has no signs of current illness, and is nontoxic appearing. Based on these findings, I do not see a reason why the patient cannot return to his treatment program. Return precautions were discussed. Patient was also reminded of the recommendation that he have a repeat chest x-ray in 3-4 weeks from the original which would be between January 2 and January 9 in order to assure that the abnormality seen in the x-ray was actually due to pneumonia and was not due to something cancerous. Patient acknowledged this and vowed to return for repeat chest x-ray.   I personally performed the services described in this documentation, which was scribed in my presence. The recorded information has been reviewed and is accurate.  Final Clinical Impressions(s) / ED Diagnoses   Final diagnoses:  Follow-up exam    New Prescriptions Discharge Medication List as of 10/07/2016 12:08 PM       Anselm PancoastShawn C Charizma Gardiner, PA-C 10/08/16 1222    Lorre NickAnthony Allen, MD 10/08/16 [phone]

## 2019-07-22 ENCOUNTER — Emergency Department (HOSPITAL_COMMUNITY): Payer: Self-pay

## 2019-07-22 ENCOUNTER — Emergency Department (HOSPITAL_COMMUNITY)
Admission: EM | Admit: 2019-07-22 | Discharge: 2019-07-23 | Disposition: A | Discharge status: Home / Self Care | Payer: Self-pay | Attending: Emergency Medicine | Admitting: Emergency Medicine

## 2019-07-22 ENCOUNTER — Encounter (HOSPITAL_COMMUNITY): Payer: Self-pay | Admitting: Emergency Medicine

## 2019-07-22 DIAGNOSIS — Y939 Activity, unspecified: Secondary | ICD-10-CM | POA: Insufficient documentation

## 2019-07-22 DIAGNOSIS — F17210 Nicotine dependence, cigarettes, uncomplicated: Secondary | ICD-10-CM | POA: Insufficient documentation

## 2019-07-22 DIAGNOSIS — Y999 Unspecified external cause status: Secondary | ICD-10-CM | POA: Insufficient documentation

## 2019-07-22 DIAGNOSIS — Y929 Unspecified place or not applicable: Secondary | ICD-10-CM | POA: Insufficient documentation

## 2019-07-22 DIAGNOSIS — F10920 Alcohol use, unspecified with intoxication, uncomplicated: Secondary | ICD-10-CM | POA: Insufficient documentation

## 2019-07-22 DIAGNOSIS — Y908 Blood alcohol level of 240 mg/100 ml or more: Secondary | ICD-10-CM | POA: Insufficient documentation

## 2019-07-22 DIAGNOSIS — S0001XA Abrasion of scalp, initial encounter: Secondary | ICD-10-CM | POA: Insufficient documentation

## 2019-07-22 DIAGNOSIS — T07XXXA Unspecified multiple injuries, initial encounter: Secondary | ICD-10-CM

## 2019-07-22 DIAGNOSIS — R451 Restlessness and agitation: Secondary | ICD-10-CM | POA: Insufficient documentation

## 2019-07-22 LAB — URINALYSIS, ROUTINE W REFLEX MICROSCOPIC
Bilirubin Urine: NEGATIVE
Glucose, UA: NEGATIVE mg/dL
Hgb urine dipstick: NEGATIVE
Ketones, ur: NEGATIVE mg/dL
Leukocytes,Ua: NEGATIVE
Nitrite: NEGATIVE
Protein, ur: NEGATIVE mg/dL
Specific Gravity, Urine: 1.004 — ABNORMAL LOW (ref 1.005–1.030)
pH: 6 (ref 5.0–8.0)

## 2019-07-22 LAB — COMPREHENSIVE METABOLIC PANEL
ALT: 27 U/L (ref 0–44)
AST: 58 U/L — ABNORMAL HIGH (ref 15–41)
Albumin: 3.5 g/dL (ref 3.5–5.0)
Alkaline Phosphatase: 85 U/L (ref 38–126)
Anion gap: 16 — ABNORMAL HIGH (ref 5–15)
BUN: 6 mg/dL (ref 6–20)
CO2: 22 mmol/L (ref 22–32)
Calcium: 8.3 mg/dL — ABNORMAL LOW (ref 8.9–10.3)
Chloride: 101 mmol/L (ref 98–111)
Creatinine, Ser: 0.96 mg/dL (ref 0.61–1.24)
GFR calc Af Amer: >60 mL/min (ref >60)
GFR calc non Af Amer: >60 mL/min (ref >60)
Glucose, Bld: 116 mg/dL — ABNORMAL HIGH (ref 70–99)
Potassium: 3.5 mmol/L (ref 3.5–5.1)
Sodium: 139 mmol/L (ref 135–145)
Total Bilirubin: 0.4 mg/dL (ref 0.3–1.2)
Total Protein: 7.4 g/dL (ref 6.5–8.1)

## 2019-07-22 LAB — CBC
HCT: 47.1 % (ref 39.0–52.0)
Hemoglobin: 16.7 g/dL (ref 13.0–17.0)
MCH: 37.2 pg — ABNORMAL HIGH (ref 26.0–34.0)
MCHC: 35.5 g/dL (ref 30.0–36.0)
MCV: 104.9 fL — ABNORMAL HIGH (ref 80.0–100.0)
Platelets: 192 10*3/uL (ref 150–400)
RBC: 4.49 MIL/uL (ref 4.22–5.81)
RDW: 12.2 % (ref 11.5–15.5)
WBC: 9 10*3/uL (ref 4.0–10.5)
nRBC: 0 % (ref 0.0–0.2)

## 2019-07-22 LAB — PROTIME-INR
INR: 0.9 (ref 0.8–1.2)
Prothrombin Time: 11.5 seconds (ref 11.4–15.2)

## 2019-07-22 LAB — LACTIC ACID, PLASMA: Lactic Acid, Venous: 4.6 mmol/L (ref 0.5–1.9)

## 2019-07-22 LAB — ETHANOL: Alcohol, Ethyl (B): 397 mg/dL (ref <10)

## 2019-07-22 MED ORDER — FENTANYL CITRATE (PF) 100 MCG/2ML IJ SOLN
50.0000 ug | Freq: Once | INTRAMUSCULAR | Status: AC
  Administered 2019-07-22: 50 ug via INTRAVENOUS
  Filled 2019-07-22: qty 2

## 2019-07-22 MED ORDER — SODIUM CHLORIDE 0.9 % IV SOLN
INTRAVENOUS | Status: DC
  Administered 2019-07-22: 22:00:00 via INTRAVENOUS

## 2019-07-22 MED ORDER — IOHEXOL 300 MG/ML  SOLN
100.0000 mL | Freq: Once | INTRAMUSCULAR | Status: AC | PRN
  Administered 2019-07-22: 100 mL via INTRAVENOUS

## 2019-07-22 MED ORDER — SODIUM CHLORIDE 0.9 % IV BOLUS
1000.0000 mL | Freq: Once | INTRAVENOUS | Status: AC
  Administered 2019-07-22: 21:00:00 1000 mL via INTRAVENOUS

## 2019-07-22 MED ORDER — TETANUS-DIPHTH-ACELL PERTUSSIS 5-2.5-18.5 LF-MCG/0.5 IM SUSP: 0.5000 mL | Freq: Once | INTRAMUSCULAR | Status: DC

## 2019-07-22 MED ORDER — HALOPERIDOL LACTATE 5 MG/ML IJ SOLN
INTRAMUSCULAR | Status: AC
  Filled 2019-07-22: qty 1

## 2019-07-22 NOTE — ED Notes (Signed)
Provider was notified of current Chem 8 that was processed

## 2019-07-22 NOTE — ED Notes (Signed)
Patient transported to CT 
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
## 2019-07-22 NOTE — ED Notes (Signed)
Pt given haldol 5mg  IV per verbal order EDP.

## 2019-07-23 ENCOUNTER — Emergency Department (HOSPITAL_COMMUNITY): Payer: Self-pay

## 2019-07-23 LAB — POCT I-STAT EG7
Acid-base deficit: 1 mmol/L (ref 0.0–2.0)
Bicarbonate: 22.5 mmol/L (ref 20.0–28.0)
Calcium, Ion: 0.97 mmol/L — ABNORMAL LOW (ref 1.15–1.40)
HCT: 41 % (ref 39.0–52.0)
Hemoglobin: 13.9 g/dL (ref 13.0–17.0)
O2 Saturation: 99 %
Potassium: 3.2 mmol/L — ABNORMAL LOW (ref 3.5–5.1)
Sodium: 145 mmol/L (ref 135–145)
TCO2: 24 mmol/L (ref 22–32)
pCO2, Ven: 33.4 mmHg — ABNORMAL LOW (ref 44.0–60.0)
pH, Ven: 7.436 — ABNORMAL HIGH (ref 7.250–7.430)
pO2, Ven: 115 mmHg — ABNORMAL HIGH (ref 32.0–45.0)

## 2019-07-23 LAB — I-STAT CHEM 8, ED
BUN: <3 mg/dL — ABNORMAL LOW (ref 6–20)
Calcium, Ion: 0.88 mmol/L — CL (ref 1.15–1.40)
Chloride: 106 mmol/L (ref 98–111)
Creatinine, Ser: 1 mg/dL (ref 0.61–1.24)
Glucose, Bld: 84 mg/dL (ref 70–99)
HCT: 41 % (ref 39.0–52.0)
Hemoglobin: 13.9 g/dL (ref 13.0–17.0)
Potassium: 3.1 mmol/L — ABNORMAL LOW (ref 3.5–5.1)
Sodium: 144 mmol/L (ref 135–145)
TCO2: 22 mmol/L (ref 22–32)

## 2019-07-23 LAB — POTASSIUM: Potassium: 3.2 mmol/L — ABNORMAL LOW (ref 3.5–5.1)

## 2019-07-23 LAB — CDS SEROLOGY

## 2019-07-23 LAB — LACTIC ACID, PLASMA: Lactic Acid, Venous: 2.6 mmol/L (ref 0.5–1.9)

## 2019-07-23 NOTE — ED Notes (Signed)
Pt given [place] sandwich; reports feeling slightly lightheaded when sitting up

## 2019-07-23 NOTE — ED Notes (Signed)
Provider was notified of current read out of iCa reading from I-STAT

## 2019-07-23 NOTE — ED Notes (Signed)
Pt given ginger ale.
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
## 2019-07-23 NOTE — ED Notes (Signed)
Pt provided Coke to drink; attempting to locate a ride home
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
## 2019-07-23 NOTE — Discharge Instructions (Signed)
Your testing is negative for serious traumatic injury.  Follow-up with your primary doctor.  Your CT scan of your chest did show some abnormalities of your airway which you should follow-up with the thoracic surgeon about. Return to the ED with difficulty breathing, chest pain, any other concerns.

## 2019-07-26 LAB — I-STAT CHEM 8, ED
BUN: 4 mg/dL — ABNORMAL LOW (ref 6–20)
Calcium, Ion: 0.9 mmol/L — ABNORMAL LOW (ref 1.15–1.40)
Chloride: 105 mmol/L (ref 98–111)
Creatinine, Ser: 1.1 mg/dL (ref 0.61–1.24)
Glucose, Bld: 86 mg/dL (ref 70–99)
HCT: 43 % (ref 39.0–52.0)
Hemoglobin: 14.6 g/dL (ref 13.0–17.0)
Potassium: 6.3 mmol/L (ref 3.5–5.1)
Sodium: 141 mmol/L (ref 135–145)
TCO2: 27 mmol/L (ref 22–32)

## 2021-02-28 ENCOUNTER — Other Ambulatory Visit: Payer: Self-pay

## 2021-02-28 ENCOUNTER — Emergency Department (HOSPITAL_COMMUNITY)
Admission: EM | Admit: 2021-02-28 | Discharge: 2021-03-01 | Disposition: A | Discharge status: Home / Self Care | Payer: Self-pay | Attending: Emergency Medicine | Admitting: Emergency Medicine

## 2021-02-28 DIAGNOSIS — J449 Chronic obstructive pulmonary disease, unspecified: Secondary | ICD-10-CM | POA: Insufficient documentation

## 2021-02-28 DIAGNOSIS — F17210 Nicotine dependence, cigarettes, uncomplicated: Secondary | ICD-10-CM | POA: Insufficient documentation

## 2021-02-28 DIAGNOSIS — K529 Noninfective gastroenteritis and colitis, unspecified: Secondary | ICD-10-CM | POA: Insufficient documentation
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
## 2021-03-01 ENCOUNTER — Emergency Department (HOSPITAL_COMMUNITY): Payer: Self-pay

## 2021-03-01 ENCOUNTER — Encounter (HOSPITAL_COMMUNITY): Payer: Self-pay

## 2021-03-01 ENCOUNTER — Other Ambulatory Visit: Payer: Self-pay

## 2021-03-01 LAB — COMPREHENSIVE METABOLIC PANEL
ALT: 16 U/L (ref 0–44)
AST: 35 U/L (ref 15–41)
Albumin: 3.4 g/dL — ABNORMAL LOW (ref 3.5–5.0)
Alkaline Phosphatase: 55 U/L (ref 38–126)
Anion gap: 10 (ref 5–15)
BUN: 10 mg/dL (ref 6–20)
CO2: 24 mmol/L (ref 22–32)
Calcium: 9 mg/dL (ref 8.9–10.3)
Chloride: 103 mmol/L (ref 98–111)
Creatinine, Ser: 0.74 mg/dL (ref 0.61–1.24)
GFR, Estimated: >60 mL/min (ref >60)
Glucose, Bld: 179 mg/dL — ABNORMAL HIGH (ref 70–99)
Potassium: 3.7 mmol/L (ref 3.5–5.1)
Sodium: 137 mmol/L (ref 135–145)
Total Bilirubin: 0.5 mg/dL (ref 0.3–1.2)
Total Protein: 6.9 g/dL (ref 6.5–8.1)

## 2021-03-01 LAB — CBC
HCT: 47.4 % (ref 39.0–52.0)
Hemoglobin: 16.2 g/dL (ref 13.0–17.0)
MCH: 37 pg — ABNORMAL HIGH (ref 26.0–34.0)
MCHC: 34.2 g/dL (ref 30.0–36.0)
MCV: 108.2 fL — ABNORMAL HIGH (ref 80.0–100.0)
Platelets: 278 10*3/uL (ref 150–400)
RBC: 4.38 MIL/uL (ref 4.22–5.81)
RDW: 12.3 % (ref 11.5–15.5)
WBC: 13.1 10*3/uL — ABNORMAL HIGH (ref 4.0–10.5)
nRBC: 0 % (ref 0.0–0.2)

## 2021-03-01 LAB — LIPASE, BLOOD: Lipase: 35 U/L (ref 11–51)

## 2021-03-01 LAB — TROPONIN I (HIGH SENSITIVITY): Troponin I (High Sensitivity): 3 ng/L (ref <18)

## 2021-03-01 MED ORDER — DICYCLOMINE HCL 20 MG PO TABS: 20.0000 mg | ORAL_TABLET | Freq: Two times a day (BID) | ORAL | 0 refills | Status: DC

## 2021-03-01 MED ORDER — ONDANSETRON HCL 4 MG/2ML IJ SOLN
4.0000 mg | Freq: Once | INTRAMUSCULAR | Status: AC
  Administered 2021-03-01: 4 mg via INTRAVENOUS
  Filled 2021-03-01: qty 2

## 2021-03-01 MED ORDER — ALUM & MAG HYDROXIDE-SIMETH 200-200-20 MG/5ML PO SUSP
30.0000 mL | Freq: Once | ORAL | Status: AC
  Administered 2021-03-01: 30 mL via ORAL
  Filled 2021-03-01: qty 30

## 2021-03-01 MED ORDER — SUCRALFATE 1 GM/10ML PO SUSP: 1.0000 g | Freq: Three times a day (TID) | ORAL | 0 refills | Status: AC

## 2021-03-01 MED ORDER — DICYCLOMINE HCL 20 MG PO TABS: 20.0000 mg | ORAL_TABLET | Freq: Two times a day (BID) | ORAL | 0 refills | Status: AC

## 2021-03-01 MED ORDER — FAMOTIDINE IN NACL 20-0.9 MG/50ML-% IV SOLN
20.0000 mg | Freq: Once | INTRAVENOUS | Status: AC
  Administered 2021-03-01: 20 mg via INTRAVENOUS
  Filled 2021-03-01: qty 50

## 2021-03-01 MED ORDER — COMBIVENT RESPIMAT 20-100 MCG/ACT IN AERS: 1.0000 | INHALATION_SPRAY | Freq: Four times a day (QID) | RESPIRATORY_TRACT | 0 refills | Status: AC | PRN

## 2021-03-01 MED ORDER — KETOROLAC TROMETHAMINE 30 MG/ML IJ SOLN
15.0000 mg | Freq: Once | INTRAMUSCULAR | Status: AC
  Administered 2021-03-01: 15 mg via INTRAVENOUS
  Filled 2021-03-01: qty 1

## 2021-03-01 MED ORDER — FENTANYL CITRATE (PF) 100 MCG/2ML IJ SOLN
50.0000 ug | Freq: Once | INTRAMUSCULAR | Status: AC
  Administered 2021-03-01: 50 ug via INTRAVENOUS
  Filled 2021-03-01: qty 2

## 2021-03-01 MED ORDER — SODIUM CHLORIDE 0.9 % IV BOLUS
500.0000 mL | Freq: Once | INTRAVENOUS | Status: AC
  Administered 2021-03-01: 500 mL via INTRAVENOUS

## 2021-03-01 MED ORDER — ALBUTEROL SULFATE HFA 108 (90 BASE) MCG/ACT IN AERS
2.0000 | INHALATION_SPRAY | Freq: Once | RESPIRATORY_TRACT | Status: AC
  Administered 2021-03-01: 2 via RESPIRATORY_TRACT
  Filled 2021-03-01: qty 6.7

## 2021-03-01 NOTE — ED Notes (Signed)
US at bedside

## 2021-03-01 NOTE — ED Notes (Signed)
Asked patient to provide urine sample, states he is unable to at this time. Given urinal

## 2021-03-01 NOTE — ED Provider Notes (Signed)
[facility]-EMERGENCY DEPT Provider Note   CSN: [id number] Arrival date & time: 02/28/21  2352     History Chief Complaint  Patient presents with  . Abdominal Pain    Nicholas Everett is a 55 y.o. male.  The history is provided by the patient.  Abdominal Pain Pain location:  Generalized Pain quality: cramping   Pain radiates to:  Chest Pain severity:  Severe Onset quality:  Gradual Duration:  1 day Timing:  Constant Progression:  Worsening Chronicity:  New Context: alcohol use   Context: not awakening from sleep and not retching   Relieved by:  Nothing Worsened by:  Nothing Ineffective treatments:  None tried Associated symptoms: no anorexia, no belching, no chest pain, no chills, no constipation, no cough, no diarrhea, no dysuria, no fatigue, no fever, no flatus, no hematemesis, no hematochezia, no hematuria, no melena, no nausea, no shortness of breath, no sore throat, no vaginal bleeding, no vaginal discharge and no vomiting   Risk factors: no alcohol abuse        Past Medical History:  Diagnosis Date  . Alcohol abuse   . Alcoholism (HCC)   . Polysubstance abuse [facility](HCC)     Patient Active Problem List   Diagnosis Date Noted  . Alcohol use disorder, severe, dependence (HCC) 07/26/2015  . Alcohol abuse     Past Surgical History:  Procedure Laterality Date  . HERNIA REPAIR         History reviewed. No pertinent family history.  Social History   Tobacco Use  . Smoking status: Current Every Day Smoker    Packs/day: 1.00    Years: 30.00    Pack years: 30.00    Types: Cigarettes  . Smokeless tobacco: Never Used  Substance Use Topics  . Alcohol use: Yes    Alcohol/week: 12.0 standard drinks    Types: 12 Cans of beer per week    Comment: Avg 12 beers daily x 20 yrs, 1/5 of liquor  . Drug use: Yes    Comment: percocet, valium "a little bit of everything"    Home Medications Prior to Admission medications   Medication Sig Start  Date End Date Taking? Authorizing Provider  sucralfate (CARAFATE) 1 GM/10ML suspension Take 10 mLs (1 g total) by mouth 4 (four) times daily -  with meals and at bedtime. 03/01/21  Yes Sidney Kann, MD  dicyclomine (BENTYL) 20 MG tablet Take 1 tablet (20 mg total) by mouth 2 (two) times daily. 03/01/21   Coline Calkin, MD    Allergies    Tylenol [acetaminophen]  Review of Systems   Review of Systems  Constitutional: Negative for chills, fatigue and fever.  HENT: Negative for sore throat.   Eyes: Negative for visual disturbance.  Respiratory: Negative for cough and shortness of breath.   Cardiovascular: Negative for chest pain, palpitations and leg swelling.  Gastrointestinal: Positive for abdominal pain. Negative for anorexia, constipation, diarrhea, flatus, hematemesis, hematochezia, melena, nausea and vomiting.  Genitourinary: Negative for dysuria, hematuria, vaginal bleeding and vaginal discharge.  Musculoskeletal: Negative for arthralgias.  Skin: Negative for wound.  Neurological: Negative for dizziness.  Psychiatric/Behavioral: Negative for agitation.  All other systems reviewed and are negative.   Physical Exam Updated Vital Signs BP (!) 130/99   Pulse 83   Temp 98.6 F (37 C)   Resp 20   SpO2 93%   Physical Exam Vitals and nursing note reviewed.  Constitutional:      General: He is not in acute  distress.    Appearance: Normal appearance.  HENT:     Head: Normocephalic and atraumatic.     Nose: Nose normal.  Eyes:     Conjunctiva/sclera: Conjunctivae normal.     Pupils: Pupils are equal, round, and reactive to light.  Cardiovascular:     Rate and Rhythm: Normal rate and regular rhythm.     Pulses: Normal pulses.     Heart sounds: Normal heart sounds.  Pulmonary:     Effort: No accessory muscle usage or respiratory distress.     Breath sounds: Decreased breath sounds present.  Abdominal:     General: Abdomen is flat. Bowel sounds are normal.     Palpations:  Abdomen is soft.     Tenderness: There is no abdominal tenderness. There is no guarding.  Musculoskeletal:        General: Normal range of motion.     Cervical back: Normal range of motion and neck supple.  Skin:    General: Skin is warm and dry.     Capillary Refill: Capillary refill takes less than 2 seconds.  Neurological:     General: No focal deficit present.     Mental Status: He is alert and oriented to person, place, and time.     Deep Tendon Reflexes: Reflexes normal.  Psychiatric:        Mood and Affect: Mood normal.        Behavior: Behavior normal.     ED Results / Procedures / Treatments   Labs (all labs ordered are listed, but only abnormal results are displayed) Results for orders placed or performed during the hospital encounter of 02/28/21  Lipase, blood  Result Value Ref Range   Lipase 35 11 - 51 U/L  Comprehensive metabolic panel  Result Value Ref Range   Sodium 137 135 - 145 mmol/L   Potassium 3.7 3.5 - 5.1 mmol/L   Chloride 103 98 - 111 mmol/L   CO2 24 22 - 32 mmol/L   Glucose, Bld 179 (H) 70 - 99 mg/dL   BUN 10 6 - 20 mg/dL   Creatinine, Ser 1.61 0.61 - 1.24 mg/dL   Calcium 9.0 8.9 - 09.6 mg/dL   Total Protein 6.9 6.5 - 8.1 g/dL   Albumin 3.4 (L) 3.5 - 5.0 g/dL   AST 35 15 - 41 U/L   ALT 16 0 - 44 U/L   Alkaline Phosphatase 55 38 - 126 U/L   Total Bilirubin 0.5 0.3 - 1.2 mg/dL   GFR, Estimated >04 >54 mL/min   Anion gap 10 5 - 15  CBC  Result Value Ref Range   WBC 13.1 (H) 4.0 - 10.5 K/uL   RBC 4.38 4.22 - 5.81 MIL/uL   Hemoglobin 16.2 13.0 - 17.0 g/dL   HCT 09.8 11.9 - 14.7 %   MCV 108.2 (H) 80.0 - 100.0 fL   MCH 37.0 (H) 26.0 - 34.0 pg   MCHC 34.2 30.0 - 36.0 g/dL   RDW 82.9 56.2 - 13.0 %   Platelets 278 150 - 400 K/uL   nRBC 0.0 0.0 - 0.2 %  Troponin I (High Sensitivity)  Result Value Ref Range   Troponin I (High Sensitivity) 3 <18 ng/L   US Abdomen Limited  Result Date: 03/01/2021 CLINICAL DATA:  Right upper quadrant pain since  last night. Alcoholism. EXAM: ULTRASOUND ABDOMEN LIMITED RIGHT UPPER QUADRANT COMPARISON:  CT renal 03/02/2019, CT abdomen pelvis 07/22/2019 FINDINGS: Gallbladder: No gallstones. No definite gallbladder wall thickening. No sonographic  Murphy sign noted by sonographer. Common bile duct: Diameter: 2 mm. Liver: No definite hepatic nodularity. No focal lesion identified. Normal parenchymal echogenicity. Portal vein is patent on color Doppler imaging with normal direction of blood flow towards the liver. Other: Small volume simple free fluid ascites. IMPRESSION: 1. Small volume simple free fluid ascites. 2. Otherwise unremarkable right upper quadrant ultrasound. Electronically Signed   By: Tish Frederickson M.D.   On: 03/01/2021 05:31   DG Chest Portable 1 View  Result Date: 03/01/2021 CLINICAL DATA:  Hypoxia EXAM: PORTABLE CHEST 1 VIEW COMPARISON:  Chest x-ray 07/18/2020, CT chest 07/22/2019 FINDINGS: The heart size and mediastinal contours are within normal limits. Slightly increased prominence of the hilar vasculature. No focal consolidation. No pulmonary edema. No pleural effusion. No pneumothorax. No acute osseous abnormality. IMPRESSION: Mild vascular congestion. Electronically Signed   By: Tish Frederickson M.D.   On: 03/01/2021 05:25   CT Renal Stone Study  Result Date: 03/01/2021 CLINICAL DATA:  Thick. White blood cell count 13. Alcohol and substance use. Hernia repair. EXAM: CT ABDOMEN AND PELVIS WITHOUT CONTRAST TECHNIQUE: Multidetector CT imaging of the abdomen and pelvis was performed following the standard protocol without IV contrast. COMPARISON:  CT abdomen pelvis 07/22/2019 report without imaging FINDINGS: Lower chest: Subpleural ground-glass airspace opacity within the right lower lobe. Hepatobiliary: No focal liver abnormality. No gallstones. Question gallbladder wall thickening. No biliary dilatation. Pancreas: No focal lesion. Normal pancreatic contour. No surrounding inflammatory changes. No  main pancreatic ductal dilatation. Spleen: Normal in size without focal abnormality. Adrenals/Urinary Tract: No adrenal nodule bilaterally. No nephrolithiasis, no hydronephrosis, and no contour-deforming renal mass. No ureterolithiasis or hydroureter. Circumferential urinary bladder wall thickening. Stomach/Bowel: Stomach is within normal limits. Diffuse small bowel thickening. No pneumatosis. No small bowel dilatation. No evidence of large bowel wall thickening or dilatation. Appendix appears normal. Vascular/Lymphatic: No abdominal aorta or iliac aneurysm. Mild atherosclerotic plaque of the aorta and its branches. No abdominal, pelvic, or inguinal lymphadenopathy. Reproductive: Prostate is unremarkable. Other: Small volume simple free fluid ascites. No intraperitoneal free gas. No organized fluid collection. Musculoskeletal: No abdominal wall hernia or abnormality. No suspicious lytic or blastic osseous lesions. No acute displaced fracture. Multilevel degenerative changes of the spine. 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 IMPRESSION: 1. Enteritis. Limited evaluation for ischemia on this noncontrast study. 2. Nonspecific gallbladder wall thickening which can be seen in the setting of chronic liver disease versus cholecystitis. 3. Small volume ascites. 4.  Aortic Atherosclerosis (ICD10-I70.0). Electronically Signed   By: Tish Frederickson M.D.   On: 03/01/2021 04:09    EKG EKG Interpretation  Date/Time:  Thursday Mar 01 2021 04:29:38 EDT Ventricular Rate:  92 PR Interval:  119 QRS Duration: 98 QT Interval:  358 QTC Calculation: 443 R Axis:   54 Text Interpretation: Sinus rhythm Borderline short PR interval Confirmed by Ammi Hutt ([id number]) on 03/01/2021 4:43:54 AM   Radiology US Abdomen Limited  Result Date: 03/01/2021 CLINICAL DATA:  Right upper quadrant pain since last night. Alcoholism. EXAM: ULTRASOUND ABDOMEN LIMITED RIGHT UPPER QUADRANT COMPARISON:   CT renal 03/02/2019, CT abdomen pelvis 07/22/2019 FINDINGS: Gallbladder: No gallstones. No definite gallbladder wall thickening. No sonographic Murphy sign noted by sonographer. Common bile duct: Diameter: 2 mm. Liver: No definite hepatic nodularity. No focal lesion identified. Normal parenchymal echogenicity. Portal vein is patent on color Doppler imaging with normal direction of blood flow towards the liver. Other: Small volume simple free fluid ascites. IMPRESSION: 1. Small volume  simple free fluid ascites. 2. Otherwise unremarkable right upper quadrant ultrasound. Electronically Signed   By: Tish Frederickson M.D.   On: 03/01/2021 05:31   DG Chest Portable 1 View  Result Date: 03/01/2021 CLINICAL DATA:  Hypoxia EXAM: PORTABLE CHEST 1 VIEW COMPARISON:  Chest x-ray 07/18/2020, CT chest 07/22/2019 FINDINGS: The heart size and mediastinal contours are within normal limits. Slightly increased prominence of the hilar vasculature. No focal consolidation. No pulmonary edema. No pleural effusion. No pneumothorax. No acute osseous abnormality. IMPRESSION: Mild vascular congestion. Electronically Signed   By: Tish Frederickson M.D.   On: 03/01/2021 05:25   CT Renal Stone Study  Result Date: 03/01/2021 CLINICAL DATA:  Thick. White blood cell count 13. Alcohol and substance use. Hernia repair. EXAM: CT ABDOMEN AND PELVIS WITHOUT CONTRAST TECHNIQUE: Multidetector CT imaging of the abdomen and pelvis was performed following the standard protocol without IV contrast. COMPARISON:  CT abdomen pelvis 07/22/2019 report without imaging FINDINGS: Lower chest: Subpleural ground-glass airspace opacity within the right lower lobe. Hepatobiliary: No focal liver abnormality. No gallstones. Question gallbladder wall thickening. No biliary dilatation. Pancreas: No focal lesion. Normal pancreatic contour. No surrounding inflammatory changes. No main pancreatic ductal dilatation. Spleen: Normal in size without focal abnormality.  Adrenals/Urinary Tract: No adrenal nodule bilaterally. No nephrolithiasis, no hydronephrosis, and no contour-deforming renal mass. No ureterolithiasis or hydroureter. Circumferential urinary bladder wall thickening. Stomach/Bowel: Stomach is within normal limits. Diffuse small bowel thickening. No pneumatosis. No small bowel dilatation. No evidence of large bowel wall thickening or dilatation. Appendix appears normal. Vascular/Lymphatic: No abdominal aorta or iliac aneurysm. Mild atherosclerotic plaque of the aorta and its branches. No abdominal, pelvic, or inguinal lymphadenopathy. Reproductive: Prostate is unremarkable. Other: Small volume simple free fluid ascites. No intraperitoneal free gas. No organized fluid collection. Musculoskeletal: No abdominal wall hernia or abnormality. No suspicious lytic or blastic osseous lesions. No acute displaced fracture. Multilevel degenerative changes of the spine. 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 IMPRESSION: 1. Enteritis. Limited evaluation for ischemia on this noncontrast study. 2. Nonspecific gallbladder wall thickening which can be seen in the setting of chronic liver disease versus cholecystitis. 3. Small volume ascites. 4.  Aortic Atherosclerosis (ICD10-I70.0). Electronically Signed   By: Tish Frederickson M.D.   On: 03/01/2021 04:09    Procedures Procedures   Medications Ordered in ED Medications  albuterol (VENTOLIN HFA) 108 (90 Base) MCG/ACT inhaler 2 puff (has no administration in time range)  ondansetron (ZOFRAN) injection 4 mg (4 mg Intravenous Given 03/01/21 0301)  famotidine (PEPCID) IVPB 20 mg premix (0 mg Intravenous Stopped 03/01/21 0305)  alum & mag hydroxide-simeth (MAALOX/MYLANTA) 200-200-20 MG/5ML suspension 30 mL (30 mLs Oral Given 03/01/21 0301)  fentaNYL (SUBLIMAZE) injection 50 mcg (50 mcg Intravenous Given 03/01/21 0422)  sodium chloride 0.9 % bolus 500 mL (0 mLs Intravenous Stopped 03/01/21 0515)   ketorolac (TORADOL) 30 MG/ML injection 15 mg (15 mg Intravenous Given 03/01/21 0422)    ED Course  I have reviewed the triage vital signs and the nursing notes.  Pertinent labs & imaging results that were available during my care of the patient were reviewed by me and considered in my medical decision making (see chart for details).   Ruled out for MI, and no surgical finding in the abdomen.  Patient has COPD and I have started inhalers.  I have also started bentyl and carafate and advised the patient to stop smoking and drinking.       Nicholas Kid  Everett was evaluated in Emergency Department on 03/01/2021 for the symptoms described in the history of present illness. He was evaluated in the context of the global COVID-19 pandemic, which necessitated consideration that the patient might be at risk for infection with the SARS-CoV-2 virus that causes COVID-19. Institutional protocols and algorithms that pertain to the evaluation of patients at risk for COVID-19 are in a state of rapid change based on information released by regulatory bodies including the CDC and federal and state organizations. These policies and algorithms were followed during the patient's care in the ED.  Final Clinical Impression(s) / ED Diagnoses Final diagnoses:  Enteritis  Chronic obstructive pulmonary disease, unspecified COPD type (HCC)   Return for intractable cough, coughing up blood, fevers >100.4 unrelieved by medication, shortness of breath, intractable vomiting, chest pain, shortness of breath, weakness, numbness, changes in speech, facial asymmetry, abdominal pain, passing out, Inability to tolerate liquids or food, cough, altered mental status or any concerns. No signs of systemic illness or infection. The patient is nontoxic-appearing on exam and vital signs are within normal limits.  I have reviewed the triage vital signs and the nursing notes. Pertinent labs & imaging results that were available during my care of  the patient were reviewed by me and considered in my medical decision making (see chart for details). After history, exam, and medical workup I feel the patient has been appropriately medically screened and is safe for discharge home. Pertinent diagnoses were discussed with the patient. Patient was given return precautions.  Rx / DC Orders ED Discharge Orders         Ordered    dicyclomine (BENTYL) 20 MG tablet  2 times daily,   Status:  Discontinued        03/01/21 0541    dicyclomine (BENTYL) 20 MG tablet  2 times daily        03/01/21 0542    sucralfate (CARAFATE) 1 GM/10ML suspension  3 times daily with meals & bedtime        03/01/21 0542           Ellanor Feuerstein, MD 03/01/21 3300

## 2021-03-21 DEATH — deceased

## 2023-03-26 IMAGING — CT CT RENAL STONE PROTOCOL
2 of 4 series · 15 of 46 positions shown, 17 images · non-contrast
Comparison: CT abdomen pelvis 07/22/2019 report without imaging

CLINICAL DATA: Thick. White blood cell count 13. Alcohol and
substance use. Hernia repair.

EXAM:
CT ABDOMEN AND PELVIS WITHOUT CONTRAST
TECHNIQUE: Multidetector CT imaging of the abdomen and pelvis was performed
following the standard protocol without IV contrast.

[Series 2: axial st · axial · 0.65mm/px · z∈[+1041,+1416]mm · 12 of 85 slices shown, 14 images]
[im 5/85  soft-tissue]
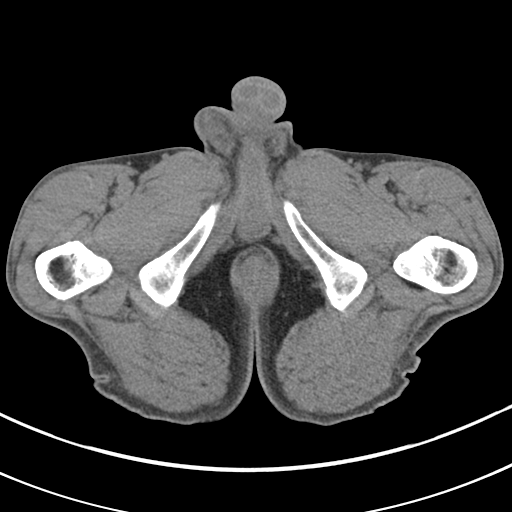
[im 5/85  bone]
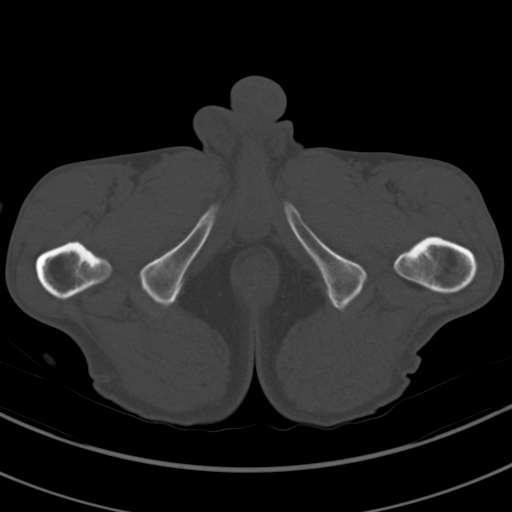
[im 14/85  soft-tissue]
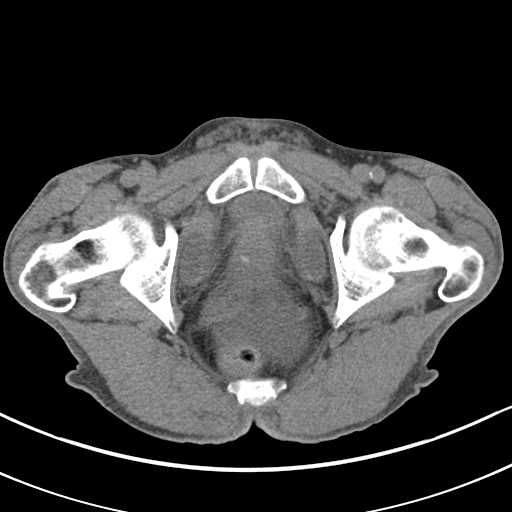
[im 18/85  soft-tissue]
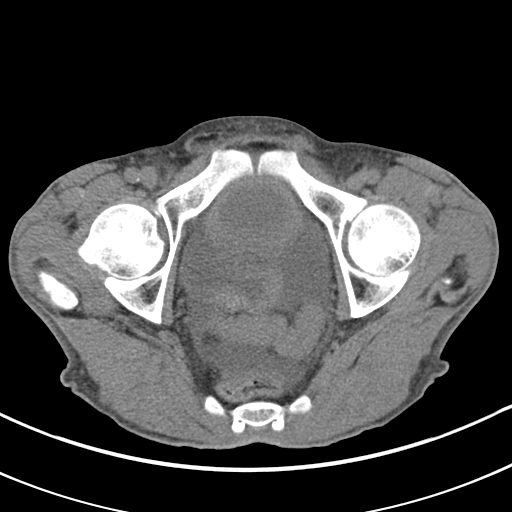
[im 27/85  soft-tissue]
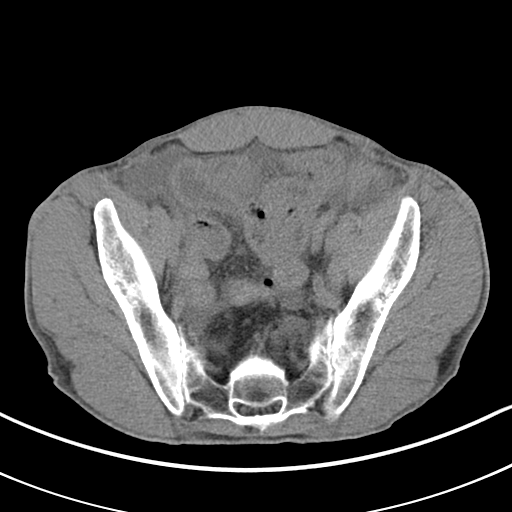
[im 31/85  soft-tissue]
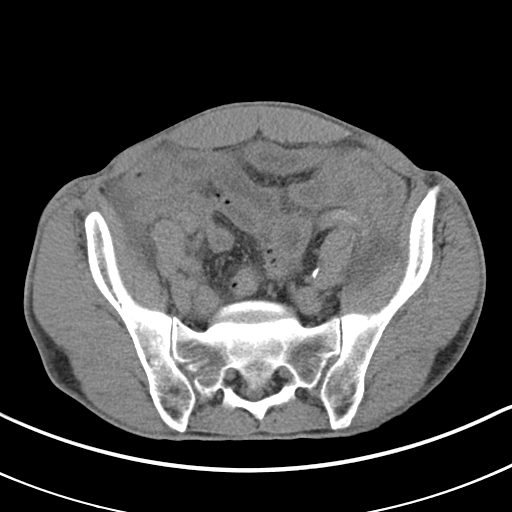
[im 40/85  soft-tissue]
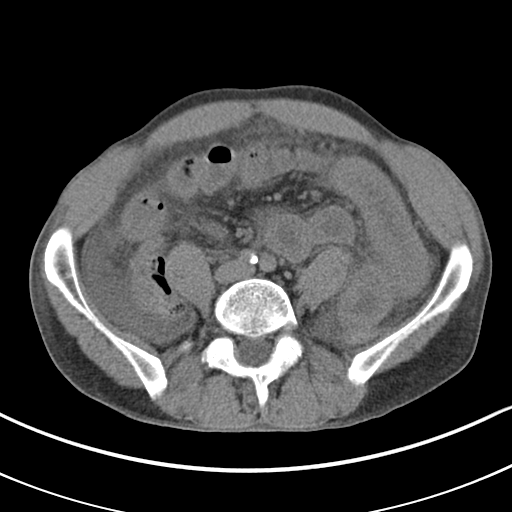
[im 45/85  soft-tissue]
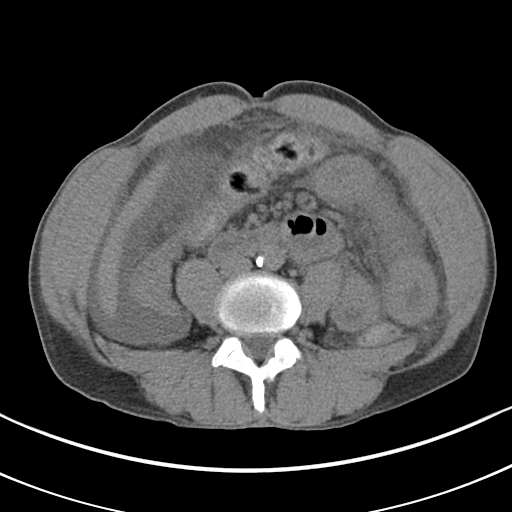
[im 54/85  soft-tissue]
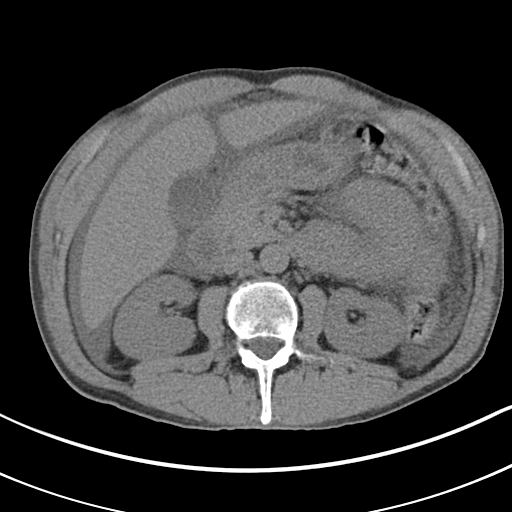
[im 58/85  soft-tissue]
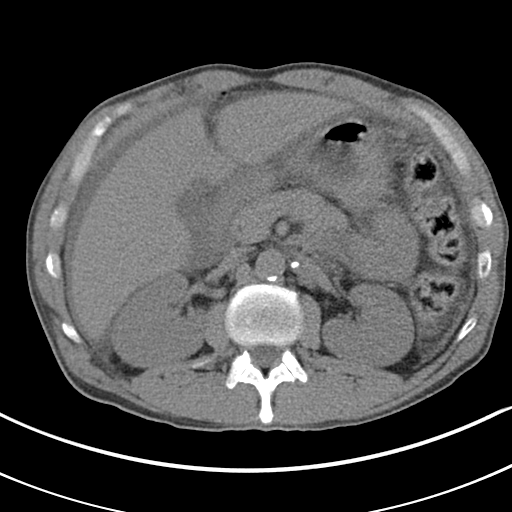
[im 58/85  bone]
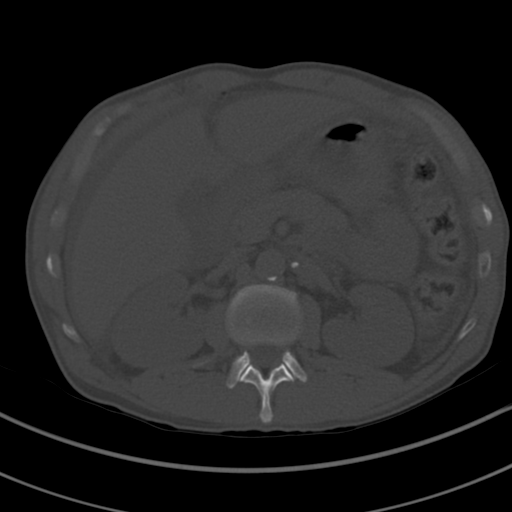
[im 67/85  soft-tissue]
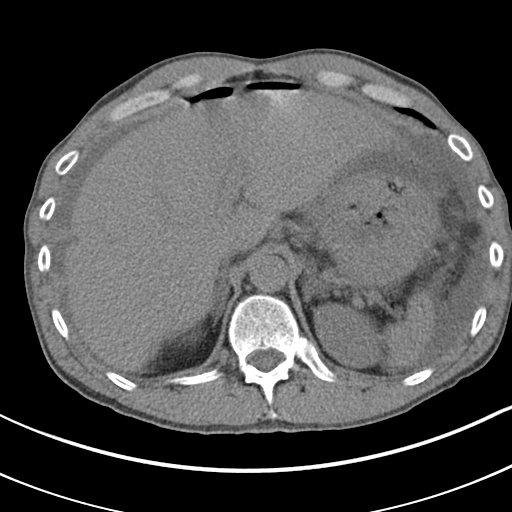
[im 71/85  soft-tissue]
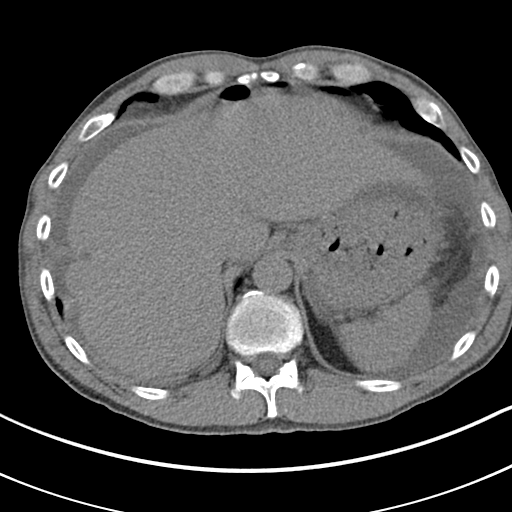
[im 80/85  soft-tissue]
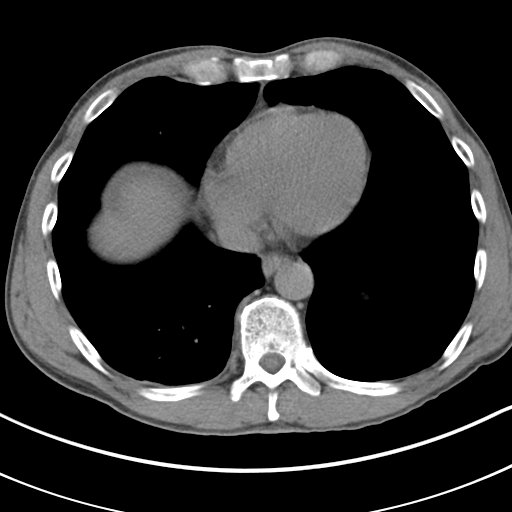

[Series 4: coronal · coronal · 0.63mm/px · 3 of 129 slices shown]
[im 43/129  soft-tissue]
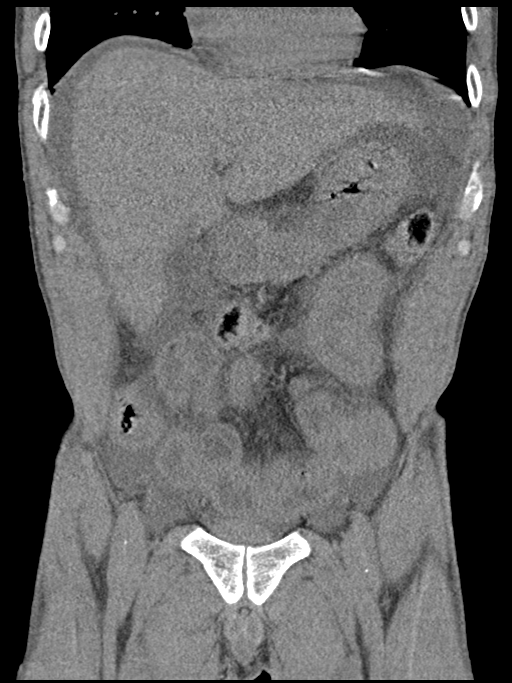
[im 57/129  soft-tissue]
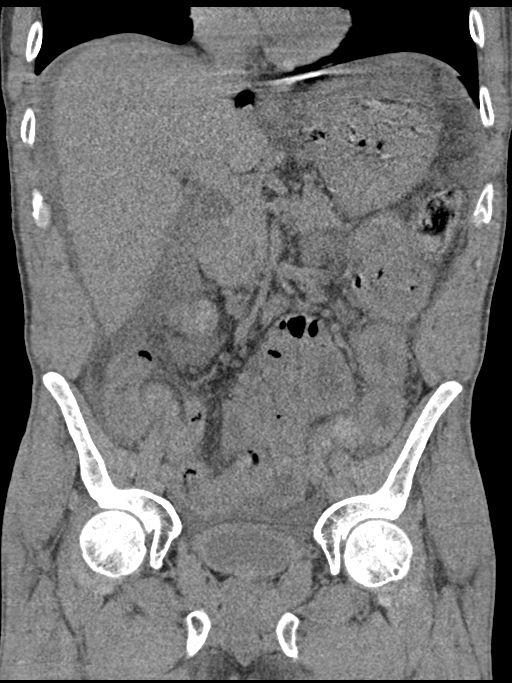
[im 72/129  soft-tissue]
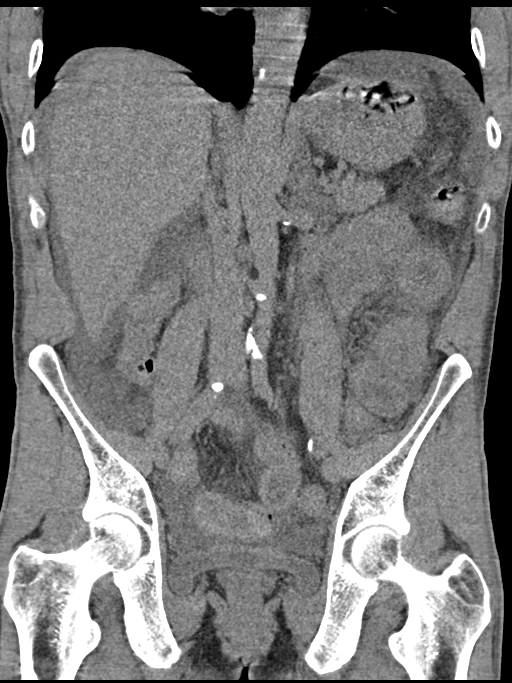

[15 of 46 positions shown; findings below may reference images not displayed]

FINDINGS: Lower chest: Subpleural ground-glass airspace opacity within the
right lower lobe.

Hepatobiliary: No focal liver abnormality. No gallstones. Question
gallbladder wall thickening. No biliary dilatation.

Pancreas: No focal lesion. Normal pancreatic contour. No surrounding
inflammatory changes. No main pancreatic ductal dilatation.

Spleen: Normal in size without focal abnormality.

Adrenals/Urinary Tract:

No adrenal nodule bilaterally.

No nephrolithiasis, no hydronephrosis, and no contour-deforming
renal mass. No ureterolithiasis or hydroureter.

Circumferential urinary bladder wall thickening.

Stomach/Bowel: Stomach is within normal limits. Diffuse small bowel
thickening. No pneumatosis. No small bowel dilatation. No evidence
of large bowel wall thickening or dilatation. Appendix appears
normal.

Vascular/Lymphatic: No abdominal aorta or iliac aneurysm. Mild
atherosclerotic plaque of the aorta and its branches. No abdominal,
pelvic, or inguinal lymphadenopathy.

Reproductive: Prostate is unremarkable.

Other: Small volume simple free fluid ascites. No intraperitoneal
free gas. No organized fluid collection.

Musculoskeletal:

No abdominal wall hernia or abnormality.

No suspicious lytic or blastic osseous lesions. No acute displaced
fracture. Multilevel degenerative changes of the spine. Grade 1
anterolisthesis of L5 on S1 with bilateral pars interarticularis
defects.
IMPRESSION: 1. Enteritis. Limited evaluation for ischemia on this noncontrast
study.
2. Nonspecific gallbladder wall thickening which can be seen in the
setting of chronic liver disease versus cholecystitis.
3. Small volume ascites.
4.  Aortic Atherosclerosis ([id number]).
